# Patient Record
Sex: Male | Born: 1955 | Race: Black or African American | Hispanic: No | Marital: Single | Smoking: Former smoker
Health system: Southern US, Community
[De-identification: ages and names within clinical notes are randomized; demographics above are authoritative.]

## PROBLEM LIST (undated history)

## (undated) DIAGNOSIS — I809 Phlebitis and thrombophlebitis of unspecified site: Secondary | ICD-10-CM

## (undated) DIAGNOSIS — I10 Essential (primary) hypertension: Secondary | ICD-10-CM

## (undated) DIAGNOSIS — M1990 Unspecified osteoarthritis, unspecified site: Secondary | ICD-10-CM

## (undated) HISTORY — PX: OTHER SURGICAL HISTORY: SHX169

---

## 2001-11-15 HISTORY — PX: JOINT REPLACEMENT: SHX530

## 2016-08-16 ENCOUNTER — Encounter: Payer: Self-pay | Admitting: Pediatric Intensive Care

## 2016-09-08 ENCOUNTER — Emergency Department (HOSPITAL_COMMUNITY)
Admission: EM | Admit: 2016-09-08 | Discharge: 2016-09-09 | Disposition: A | Discharge status: Home / Self Care | Payer: Medicaid Other | Attending: Emergency Medicine | Admitting: Emergency Medicine

## 2016-09-08 ENCOUNTER — Emergency Department (HOSPITAL_COMMUNITY): Payer: Medicaid Other

## 2016-09-08 ENCOUNTER — Encounter (HOSPITAL_COMMUNITY): Payer: Self-pay

## 2016-09-08 DIAGNOSIS — M542 Cervicalgia: Secondary | ICD-10-CM

## 2016-09-08 DIAGNOSIS — W050XXA Fall from non-moving wheelchair, initial encounter: Secondary | ICD-10-CM | POA: Diagnosis not present

## 2016-09-08 DIAGNOSIS — M25552 Pain in left hip: Secondary | ICD-10-CM | POA: Insufficient documentation

## 2016-09-08 DIAGNOSIS — S50312A Abrasion of left elbow, initial encounter: Secondary | ICD-10-CM | POA: Insufficient documentation

## 2016-09-08 DIAGNOSIS — I10 Essential (primary) hypertension: Secondary | ICD-10-CM | POA: Diagnosis not present

## 2016-09-08 DIAGNOSIS — W19XXXA Unspecified fall, initial encounter: Secondary | ICD-10-CM

## 2016-09-08 DIAGNOSIS — Y939 Activity, unspecified: Secondary | ICD-10-CM | POA: Diagnosis not present

## 2016-09-08 DIAGNOSIS — R930 Abnormal findings on diagnostic imaging of skull and head, not elsewhere classified: Secondary | ICD-10-CM | POA: Insufficient documentation

## 2016-09-08 DIAGNOSIS — M546 Pain in thoracic spine: Secondary | ICD-10-CM | POA: Diagnosis not present

## 2016-09-08 DIAGNOSIS — Y929 Unspecified place or not applicable: Secondary | ICD-10-CM | POA: Insufficient documentation

## 2016-09-08 DIAGNOSIS — Y999 Unspecified external cause status: Secondary | ICD-10-CM | POA: Diagnosis not present

## 2016-09-08 DIAGNOSIS — M25511 Pain in right shoulder: Secondary | ICD-10-CM | POA: Insufficient documentation

## 2016-09-08 DIAGNOSIS — M25512 Pain in left shoulder: Secondary | ICD-10-CM | POA: Diagnosis not present

## 2016-09-08 DIAGNOSIS — S59902A Unspecified injury of left elbow, initial encounter: Secondary | ICD-10-CM | POA: Diagnosis present

## 2016-09-08 MED ORDER — HYDROCODONE-ACETAMINOPHEN 5-325 MG PO TABS
1.0000 | ORAL_TABLET | Freq: Once | ORAL | Status: AC
  Administered 2016-09-09: 1 via ORAL
  Filled 2016-09-08: qty 1

## 2016-09-08 NOTE — ED Notes (Signed)
Pt in xray- will medicate when returned to room

## 2016-09-08 NOTE — ED Triage Notes (Signed)
Pt reports hip pain that he has had for 16 months. Pt reports the pain has gotten so bad he is now confined to a wheelchair. Pt reports today he fell out of his wheelchair. He reports pain in the shoulders, left hip, and neck.

## 2016-09-08 NOTE — ED Notes (Signed)
Pt placed in c-collar due to cervical tenderness by spine.

## 2016-09-09 ENCOUNTER — Emergency Department (HOSPITAL_COMMUNITY): Payer: Medicaid Other

## 2016-09-09 MED ORDER — HYDROCODONE-ACETAMINOPHEN 5-325 MG PO TABS
1.0000 | ORAL_TABLET | Freq: Once | ORAL | Status: AC
  Administered 2016-09-09: 1 via ORAL
  Filled 2016-09-09: qty 1

## 2016-09-09 MED ORDER — CYCLOBENZAPRINE HCL 10 MG PO TABS
5.0000 mg | ORAL_TABLET | Freq: Once | ORAL | Status: AC
  Administered 2016-09-09: 5 mg via ORAL
  Filled 2016-09-09: qty 1

## 2016-09-09 MED ORDER — CYCLOBENZAPRINE HCL 5 MG PO TABS: 5.0000 mg | ORAL_TABLET | Freq: Three times a day (TID) | ORAL | 0 refills | Status: DC | PRN

## 2016-09-09 MED ORDER — HYDROCODONE-ACETAMINOPHEN 5-325 MG PO TABS: 1.0000 | ORAL_TABLET | ORAL | 0 refills | Status: DC | PRN

## 2016-09-09 MED FILL — HYDROCODON-APAP 5-325: 5-325 | 3 days supply | Qty: 18 | Fill #0

## 2016-09-09 NOTE — ED Provider Notes (Signed)
[facility]-EMERGENCY DEPT Provider Note   CSN: [id number] Arrival date & time: 09/08/16  1626     History   Chief Complaint Chief Complaint  Patient presents with  . Hip Pain  . Fall    HPI Kyle Fleming is a 60 y.o. male.  Kyle Fleming is a 60 y.o. male recently new to area from [place] with h/o HTN presents to ED s/p fall. Patient reports this morning he was going up a wheelchair ramp to enter a bus when his wheelchair did a "wheely," falling backward causing patient to fall out of the wheel chair. He denies any head trauma or LOC. He is on Plavix, no other anti-coagulation therapy. He was evaluated at the scene by EMS and felt okay; however, as the day has progressed he has had increasing myalgias and pain. He presents with pain in b/l shoulders, neck, thoracic back, and left hip. He also endorses a small superficial abrasion to his left elbow. Last tetanus shot within the last year. No treatments tried PTA. Denies fever, numbness, weakness, loss of bowel or bladder function, IVDU, h/o cancer, or chronic corticosteroid use. Patient is wheelchair bound secondary to left chronic hip pain due to arthritis.       History reviewed. No pertinent past medical history.  There are no active problems to display for this patient.   History reviewed. No pertinent surgical history.     Home Medications    Prior to Admission medications   Medication Sig Start Date End Date Taking? Authorizing Provider  clopidogrel (PLAVIX) 75 MG tablet Take 75 mg by mouth daily.   Yes Historical Provider, MD  enalapril (VASOTEC) 10 MG tablet Take 10 mg by mouth daily.   Yes Historical Provider, MD  hydrochlorothiazide (HYDRODIURIL) 25 MG tablet Take 25 mg by mouth daily.   Yes Historical Provider, MD  lovastatin (MEVACOR) 20 MG tablet Take 20 mg by mouth daily at 6 PM.    Yes Historical Provider, MD  cyclobenzaprine (FLEXERIL) 5 MG tablet Take 1 tablet (5 mg total) by mouth 3 (three) times daily as  needed. 09/09/16   Lona Kettle, PA-C  HYDROcodone-acetaminophen (NORCO/VICODIN) 5-325 MG tablet Take 1 tablet by mouth every 4 (four) hours as needed. 09/09/16   Lona Kettle, PA-C    Family History History reviewed. No pertinent family history.  Social History Social History  Substance Use Topics  . Smoking status: Never Smoker  . Smokeless tobacco: Never Used  . Alcohol use Yes     Comment: occ     Allergies   Amitriptyline; Naproxen; and Penicillins   Review of Systems Review of Systems  Constitutional: Negative for fever.  HENT: Negative for trouble swallowing.   Eyes: Negative for visual disturbance.  Respiratory: Negative for shortness of breath.   Cardiovascular: Negative for chest pain.  Gastrointestinal: Negative for abdominal pain, blood in stool, constipation, diarrhea, nausea and vomiting.  Genitourinary: Negative for dysuria and hematuria.  Musculoskeletal: Positive for arthralgias, back pain and neck pain.  Skin: Positive for wound. Negative for rash.  Neurological: Negative for syncope, weakness, numbness and headaches.     Physical Exam Updated Vital Signs BP 140/80 (BP Location: Right Arm)   Pulse 83   Temp 98.6 F (37 C)   Resp 16   SpO2 100%   Physical Exam  Constitutional: He appears well-developed and well-nourished. No distress.  HENT:  Head: Normocephalic and atraumatic. Head is without raccoon's eyes and without Battle's sign.  Mouth/Throat: Oropharynx is clear  and moist. No oropharyngeal exudate.  Eyes: Conjunctivae and EOM are normal. Pupils are equal, round, and reactive to light. Right eye exhibits no discharge. Left eye exhibits no discharge. No scleral icterus.  Neck: Normal range of motion and phonation normal. Neck supple. Spinous process tenderness and muscular tenderness present. No neck rigidity. Normal range of motion present.  Mild TTP of cervical spine. TTP of b/l trapezius muscles. Neck ROM intact.     Cardiovascular: Normal rate, regular rhythm, normal heart sounds and intact distal pulses.   No murmur heard. Pulmonary/Chest: Effort normal and breath sounds normal. No stridor. No respiratory distress. He has no wheezes. He has no rales.  Abdominal: Soft. Bowel sounds are normal. He exhibits no distension. There is no tenderness. There is no rigidity, no rebound, no guarding and no CVA tenderness.  Musculoskeletal: Normal range of motion.       Left hip: He exhibits tenderness. He exhibits no swelling and no deformity.       Cervical back: He exhibits tenderness. He exhibits no deformity.       Thoracic back: He exhibits tenderness.  Mild TTP of thoracic spine and left paravertebral muscles; no obvious deformity; no step off. No obvious deformity, step off, or TTP of midline lumbar spine. No TTP of shoulder girdle b/l; ROM, strength, sensation, and distal pulses intact. TTP of left hip; no obvious deformity, swelling, discoloration; able to lift leg minimally; sensation intact; dorsiflexion/plantar flexion equal b/l; distal pulses intact.   Lymphadenopathy:    He has no cervical adenopathy.  Neurological: He is alert. He has normal strength. He is not disoriented. No sensory deficit. Coordination normal. GCS eye subscore is 4. GCS verbal subscore is 5. GCS motor subscore is 6.  Mental Status:  Alert, thought content appropriate, able to give a coherent history. Speech fluent without evidence of aphasia. Able to follow 2 step commands without difficulty.  Cranial Nerves:  II:  Peripheral visual fields grossly normal, pupils equal, round, reactive to light III,IV, VI: ptosis not present, extra-ocular motions intact bilaterally  V,VII: smile symmetric, facial light touch sensation equal VIII: hearing grossly normal to voice  X: uvula elevates symmetrically  XI: bilateral shoulder shrug symmetric and strong XII: midline tongue extension without fassiculations Motor:  Normal tone. Strong and  equal grip strength and dorsiflexion/plantar flexion Sensory: light touch normal in all extremities. Cerebellar: normal finger-to-nose with bilateral upper extremities Gait:deferred, pt wheel chair bound secondary to chronic left hip pain CV: distal pulses palpable throughout   Skin: Skin is warm and dry. He is not diaphoretic.     Psychiatric: He has a normal mood and affect. His behavior is normal.     ED Treatments / Results  Labs (all labs ordered are listed, but only abnormal results are displayed) Labs Reviewed - No data to display  EKG  EKG Interpretation None       Radiology Dg Cervical Spine Complete  Result Date: 09/09/2016 CLINICAL DATA:  60 year old male with fall and neck pain. EXAM: CERVICAL SPINE - COMPLETE 4+ VIEW COMPARISON:  None. FINDINGS: A bone fragment to the left of the C3 vertebra may represent soft tissue or cartilage calcification versus fracture of the left C3 transverse process. Correlation with point tenderness recommended. CT is recommended for further evaluation. No other acute fracture identified. No acute subluxation. There is degenerative changes most prominent at C5-C6 and C6-C7 with disc space narrowing and bone spurring and osteophyte formation. There is multilevel facet hypertrophy and multilevel bilateral neural  foramina narrowing. The visualized spinous processes and the odontoid appear intact. There is anatomic alignment of the lateral masses of the C1 and C2. The soft tissues appear unremarkable. IMPRESSION: Soft tissue calcification versus fracture of the left C3 transverse process. Clinical correlation is recommended. CT is recommended for further evaluation. No definite vertebral body fracture. No acute subluxation. Electronically Signed   By: Elgie Collard M.D.   On: 09/09/2016 00:29   Dg Thoracic Spine 2 View  Result Date: 09/09/2016 CLINICAL DATA:  Larey Seat today, neck and back pain. EXAM: THORACIC SPINE 2 VIEWS COMPARISON:  None.  FINDINGS: There is no evidence of thoracic spine fracture. Alignment is normal. No other significant bone abnormalities are identified. IMPRESSION: Negative. Electronically Signed   By: Awilda Metro M.D.   On: 09/09/2016 00:33   Dg Shoulder Right  Result Date: 09/08/2016 CLINICAL DATA:  Right shoulder pain.  Status post fall. EXAM: RIGHT SHOULDER - 2+ VIEW COMPARISON:  None. FINDINGS: There is no fracture or dislocation. There are mild degenerative changes of the acromioclavicular joint. IMPRESSION: No acute osseous injury of the right shoulder. Electronically Signed   By: Elige Ko   On: 09/08/2016 18:04   Dg Elbow Complete Left  Result Date: 09/08/2016 CLINICAL DATA:  Fall from wheelchair with elbow pain, initial encounter EXAM: LEFT ELBOW - COMPLETE 3+ VIEW COMPARISON:  None. FINDINGS: No acute fracture or dislocation is noted. No joint effusion is seen. A needle fragment is noted within the antecubital fossa. Correlate with clinical history. IMPRESSION: No acute bony abnormality noted. Needle fragment noted within the antecubital fossa. Correlate with clinical history. Electronically Signed   By: Alcide Clever M.D.   On: 09/08/2016 18:05   Ct Head Wo Contrast  Result Date: 09/09/2016 CLINICAL DATA:  History of fall from wheelchair. Possible fracture on radiograph EXAM: CT HEAD WITHOUT CONTRAST CT CERVICAL SPINE WITHOUT CONTRAST TECHNIQUE: Multidetector CT imaging of the head and cervical spine was performed following the standard protocol without intravenous contrast. Multiplanar CT image reconstructions of the cervical spine were also generated. COMPARISON:  Radiograph 09/08/2016, CT head 03/02/2003 FINDINGS: CT HEAD FINDINGS Brain: No evidence of acute infarction, hemorrhage, hydrocephalus, extra-axial collection or mass lesion/mass effect. Mild atrophy. Vascular: No hyperdense vessels. Calcifications within the carotid arteries at the skullbase. Skull: Mastoid air cells are clear.  No  depressed skull fractures. Sinuses/Orbits: Mild mucosal thickening in the ethmoid sinus. Old right nasal bone deformity. Old left medial wall orbital fracture with mild fat herniation toward the adjacent ethmoid sinus. Other: None CT CERVICAL SPINE FINDINGS Alignment: Straightening of the cervical spine. No subluxation. Normal facet alignment. Skull base and vertebrae: Craniovertebral junction is intact. The vertebral body heights are maintained. There is no fracture. Soft tissues and spinal canal: Prevertebral soft tissue thickness appears normal. No paravertebral or paraspinal soft tissue abnormality. Disc levels: Marked disc space narrowing at C5-C6 and C6-C7 with anterior osteophytes from C3 through T1. Posterior disc osteophyte complex at C5-C6 and C6-C7 results in mild to moderate canal stenosis. Bilateral hypertrophic facet disease results in multilevel foraminal stenosis. Upper chest: Lung apices clear. Other: There is a coarse left carotid artery calcification felt to correspond to the radiographic abnormality. IMPRESSION: 1. No CT evidence for acute intracranial abnormality. Old appearing right nasal bone and left medial orbital wall fractures. 2. Straightening of the cervical spine with multilevel degenerative disc disease. No fracture or malalignment. Carotid artery calcification on the left appears to correspond to the radiographic abnormality. Electronically Signed   By:  Jasmine PangKim  Fujinaga M.D.   On: 09/09/2016 02:22   Ct Cervical Spine Wo Contrast  Result Date: 09/09/2016 CLINICAL DATA:  History of fall from wheelchair. Possible fracture on radiograph EXAM: CT HEAD WITHOUT CONTRAST CT CERVICAL SPINE WITHOUT CONTRAST TECHNIQUE: Multidetector CT imaging of the head and cervical spine was performed following the standard protocol without intravenous contrast. Multiplanar CT image reconstructions of the cervical spine were also generated. COMPARISON:  Radiograph 09/08/2016, CT head 03/02/2003 FINDINGS: CT  HEAD FINDINGS Brain: No evidence of acute infarction, hemorrhage, hydrocephalus, extra-axial collection or mass lesion/mass effect. Mild atrophy. Vascular: No hyperdense vessels. Calcifications within the carotid arteries at the skullbase. Skull: Mastoid air cells are clear.  No depressed skull fractures. Sinuses/Orbits: Mild mucosal thickening in the ethmoid sinus. Old right nasal bone deformity. Old left medial wall orbital fracture with mild fat herniation toward the adjacent ethmoid sinus. Other: None CT CERVICAL SPINE FINDINGS Alignment: Straightening of the cervical spine. No subluxation. Normal facet alignment. Skull base and vertebrae: Craniovertebral junction is intact. The vertebral body heights are maintained. There is no fracture. Soft tissues and spinal canal: Prevertebral soft tissue thickness appears normal. No paravertebral or paraspinal soft tissue abnormality. Disc levels: Marked disc space narrowing at C5-C6 and C6-C7 with anterior osteophytes from C3 through T1. Posterior disc osteophyte complex at C5-C6 and C6-C7 results in mild to moderate canal stenosis. Bilateral hypertrophic facet disease results in multilevel foraminal stenosis. Upper chest: Lung apices clear. Other: There is a coarse left carotid artery calcification felt to correspond to the radiographic abnormality. IMPRESSION: 1. No CT evidence for acute intracranial abnormality. Old appearing right nasal bone and left medial orbital wall fractures. 2. Straightening of the cervical spine with multilevel degenerative disc disease. No fracture or malalignment. Carotid artery calcification on the left appears to correspond to the radiographic abnormality. Electronically Signed   By: Jasmine PangKim  Fujinaga M.D.   On: 09/09/2016 02:22   Ct Hip Left Wo Contrast  Result Date: 09/09/2016 CLINICAL DATA:  60 year old male with fall and left hip pain. EXAM: CT OF THE LEFT HIP WITHOUT CONTRAST TECHNIQUE: Multidetector CT imaging of the left hip was  performed according to the standard protocol. Multiplanar CT image reconstructions were also generated. COMPARISON:  Radiograph dated 09/08/2016 FINDINGS: Bones/Joint/Cartilage There is severe osteoarthritic changes of the left hip. There is severe cortical sclerosis and degeneration as well as subcortical cysts with overall remodeling of the acetabulum and the femoral head. There is proximal migration of the femoral head within the acetabulum with complete loss of joint space. There is bone spurring and osteophyte formation surrounding the head of the femur and acetabular rim. No definite acute fracture identified, however evaluation for fracture is very limited due to osteopenia and severe degenerative changes as well as artifact caused by patient's body habitus. Ill-defined linear lucencies in the region of the femoral head and neck appear peripheral and likely related to degenerative changes and osteophyte formation. Clinical correlation is recommended. If there is high clinical concern for an acute fracture, MRI is recommended for further assessment. There is no dislocation. Ligaments Suboptimally assessed by CT. Muscles and Tendons Grossly unremarkable. No inflammatory changes or fluid collection/hematoma. Soft tissues Unremarkable as visualized. No fluid collection or hematoma. Colonic diverticula. IMPRESSION: No definite acute fracture or dislocation. If there is high clinical concern for fracture MRI is recommended for further evaluation. Severe osteoarthritic changes of the left hip with remodeling of the joint and near complete loss of joint space. Electronically Signed   By:  Elgie Collard M.D.   On: 09/09/2016 00:23   Dg Shoulder Left  Result Date: 09/08/2016 CLINICAL DATA:  Left shoulder pain status post fall EXAM: LEFT SHOULDER - 2+ VIEW COMPARISON:  None. FINDINGS: There is no fracture or dislocation. There are mild degenerative changes of the acromioclavicular joint. IMPRESSION: No acute  osseous injury of the left shoulder. Electronically Signed   By: Elige Ko   On: 09/08/2016 18:05   Dg Knee Complete 4 Views Right  Result Date: 09/08/2016 CLINICAL DATA:  Fall from wheelchair with right knee pain, initial encounter EXAM: RIGHT KNEE - COMPLETE 4+ VIEW COMPARISON:  None. FINDINGS: Degenerative changes are noted in all 3 joint compartments. No acute fracture or dislocation is seen. A small joint effusion is noted. No gross soft tissue abnormality is noted. IMPRESSION: Degenerative changes with small joint effusion. No acute fracture seen. Electronically Signed   By: Alcide Clever M.D.   On: 09/08/2016 18:06   Dg Hip Unilat W Or Wo Pelvis 2-3 Views Left  Result Date: 09/08/2016 CLINICAL DATA:  Fall from wheelchair with left hip pain, initial encounter EXAM: DG HIP (WITH OR WITHOUT PELVIS) 2-3V LEFT COMPARISON:  None. FINDINGS: Right hip prosthesis is noted in satisfactory position. Severe degenerative changes of the left hip joint are noted. Some remodeling of the superior aspect of the acetabulum is seen. No acute fracture or dislocation is noted. Vascular stenting is noted on the right. IMPRESSION: Severe degenerative change of the left hip joint as described. No acute abnormality noted. Electronically Signed   By: Alcide Clever M.D.   On: 09/08/2016 18:07    Procedures Procedures (including critical care time)  Medications Ordered in ED Medications  HYDROcodone-acetaminophen (NORCO/VICODIN) 5-325 MG per tablet 1 tablet (1 tablet Oral Given 09/09/16 0021)  cyclobenzaprine (FLEXERIL) tablet 5 mg (5 mg Oral Given 09/09/16 0446)  HYDROcodone-acetaminophen (NORCO/VICODIN) 5-325 MG per tablet 1 tablet (1 tablet Oral Given 09/09/16 0446)     Initial Impression / Assessment and Plan / ED Course  I have reviewed the triage vital signs and the nursing notes.  Pertinent labs & imaging results that were available during my care of the patient were reviewed by me and considered in my  medical decision making (see chart for details).  Clinical Course  Value Comment By Time  DG Elbow Complete Left Needle noted in left decubital fossa.  Lona Kettle, [place] 10/25 2300  DG Hip Unilat W or Wo Pelvis 2-3 Views Left Significant degenerative changes noted to left hip.  Lona Kettle, [place] 10/25 2300  CT Hip Left Wo Contrast Reviewed Lona Kettle, PA-C 10/26 0040   CT head and neck reviewed.  Lona Kettle, [place] 10/26 [phone]   Patient endorses improvement in pain.  Lona Kettle, [place] 10/26 [phone]    Patient presents to ED s/p fall with complaint of left hip pain, b/l shoulder pain, neck and thoracic back pain. Patient is afebrile and non-toxic appearing in NAD. VSS. Initial x-rays while in triage included left elbow, b/l shoulders, left hip w/pelvis, and right knee. X-ray of shoulders b/l negative for acute fx or dislocation with mild arthritic changes noted; suspect MSK soreness as trapezius muscles are TTP b/l. Left hip x-ray shows significant arthritic changes without evidence of obvious fracture or dislocation. X-ray left elbow shows no fracture or dislocation; however, needle fragment noted in left antecubital fossa. Patient unsure of how fragment got there - denies IVDU or diabetes requiring insulin. Denies any  pain at the site. No TTP or obvious deformity; ROM intact; provided information for establishing a PCP to discuss possible OP removal. X-ray right knee negative for obvious fracture or dislocation, small joint effusion may be indicative of ligamentous sprain; patient is bending knee through ROM without issue. Following my assessment will image cervical and thoracic spine and CT hip for further evaluation. Pain medication given in ED.   On re-evaluation patient endorses improvement in pain he is moving left leg more freely. CT hip shows again significant degenerative changes, no obvious fracture or dislocation. Thoracic spine nml. On cervical spine x-ray  concern for possible C3 left transverse process fracture, recommend follow up CT for further evaluation. Will CT neck; given possible cervical fracture will also CT head to r/o intracranial bleed considering patient is on plavix.   CT head negative for acute intracranial abnormality. CT neck negative for fracture or subluxation; DDD noted as well as carotid artery calcification which may represent the initial radiologic finding.   Discussed results and plan with patient. Patient's pain improved with tx. Discussed symptomatic management to include RICE and motrin for mild to moderate pain. Rx vicodin for severe pain and flexeril for muscle pain. Review of [place] controlled substance database shows no recent narcotic rx. Resources for establishing a PCP provided, encouraged follow up. Orthopedic referral provided given significant arthritic changes of left hip. Return precautions given. Patient voiced understanding and is agreeable.    Final Clinical Impressions(s) / ED Diagnoses   Final diagnoses:  Fall, initial encounter  Pain of left hip joint  Acute pain of both shoulders  Neck pain  Acute left-sided thoracic back pain    New Prescriptions Discharge Medication List as of 09/09/2016  2:50 AM    START taking these medications   Details  cyclobenzaprine (FLEXERIL) 5 MG tablet Take 1 tablet (5 mg total) by mouth 3 (three) times daily as needed., Starting Thu 09/09/2016, Print    HYDROcodone-acetaminophen (NORCO/VICODIN) 5-325 MG tablet Take 1 tablet by mouth every 4 (four) hours as needed., Starting Thu 09/09/2016, Print         [place], PA-C 09/09/16 1100    Glynn Octave, MD 09/09/16 2329

## 2016-09-09 NOTE — Discharge Instructions (Signed)
Read the information below.  Your imaging is remarkable for significant arthritis in your left hip. No obvious fracture or dislocation is present. You also have a residual needle in your left at the level of your elbow. You can follow up outpatient with a primary provider regarding removal. The rest of your imaging was re-assuring.  You can take motrin 400mg  every 6hrs for mild to moderate pain. I have prescribed vicodin for severe pain. I have also prescribed flexeril for muscle pain. Vicodin and flexeril can make you drowsy, do not drive after taking and try to space out dosing.  You can apply ice for 20 minute increments to affected areas for the next 48 hours.  Use the prescribed medication as directed.  Please discuss all new medications with your pharmacist.  I have provided resources and the [facility] to help establish a primary provider, please call.  I have also provided the contact information for orthopedics to follow up regarding your left hip pain, please call to schedule an appointment.   You may return to the Emergency Department at any time for worsening condition or any new symptoms that concern you.

## 2016-09-17 ENCOUNTER — Emergency Department (HOSPITAL_COMMUNITY)
Admission: EM | Admit: 2016-09-17 | Discharge: 2016-09-17 | Disposition: A | Discharge status: Home / Self Care | Payer: Medicaid Other | Attending: Emergency Medicine | Admitting: Emergency Medicine

## 2016-09-17 ENCOUNTER — Emergency Department (HOSPITAL_COMMUNITY): Payer: Medicaid Other

## 2016-09-17 ENCOUNTER — Encounter (HOSPITAL_COMMUNITY): Payer: Self-pay | Admitting: Emergency Medicine

## 2016-09-17 DIAGNOSIS — M1990 Unspecified osteoarthritis, unspecified site: Secondary | ICD-10-CM

## 2016-09-17 DIAGNOSIS — G8929 Other chronic pain: Secondary | ICD-10-CM | POA: Diagnosis not present

## 2016-09-17 DIAGNOSIS — W050XXA Fall from non-moving wheelchair, initial encounter: Secondary | ICD-10-CM | POA: Diagnosis not present

## 2016-09-17 DIAGNOSIS — Y929 Unspecified place or not applicable: Secondary | ICD-10-CM | POA: Insufficient documentation

## 2016-09-17 DIAGNOSIS — M25552 Pain in left hip: Secondary | ICD-10-CM | POA: Insufficient documentation

## 2016-09-17 DIAGNOSIS — M25561 Pain in right knee: Secondary | ICD-10-CM | POA: Diagnosis not present

## 2016-09-17 DIAGNOSIS — Y939 Activity, unspecified: Secondary | ICD-10-CM | POA: Insufficient documentation

## 2016-09-17 DIAGNOSIS — W19XXXA Unspecified fall, initial encounter: Secondary | ICD-10-CM

## 2016-09-17 DIAGNOSIS — Y999 Unspecified external cause status: Secondary | ICD-10-CM | POA: Insufficient documentation

## 2016-09-17 MED ORDER — HYDROCODONE-ACETAMINOPHEN 5-325 MG PO TABS: 1.0000 | ORAL_TABLET | Freq: Four times a day (QID) | ORAL | 0 refills | Status: DC | PRN

## 2016-09-17 MED ORDER — OXYCODONE HCL 5 MG PO TABS
10.0000 mg | ORAL_TABLET | Freq: Once | ORAL | Status: AC
  Administered 2016-09-17: 10 mg via ORAL
  Filled 2016-09-17: qty 2

## 2016-09-17 MED FILL — HYDROCODON-APAP 5-325: 5-325 | 5 days supply | Qty: 18 | Fill #0

## 2016-09-17 NOTE — ED Triage Notes (Signed)
Pt sts left hip pain since falling from wheel chair several days ago; pt sts worsening pain and ran out of pain meds has ortho appointment on Monday

## 2016-09-17 NOTE — ED Provider Notes (Signed)
[facility]-EMER[facility]ENCY DEPT Provider Note   CSN: [id number] Arrival date & time: 09/17/16  8295     History   Chief Complaint Chief Complaint  Patient presents with  . Hip Pain    HPI Kyle Fleming is a 60 y.o. male. With a hx of severe arthritis, wheelchair bound at baseline because of this with recent fall on 10/25 wherein he fell on his left hip and precipitated acute on chronic left hip pain. He had a CT scan at that time which showed no acute fracture but noted severe arthritic changes.  3 days ago he had another mechanical fall from his wheelchair causing him to land on his right knee. He notes similar chronic knee pain due to OA but states that it is worse than it normally is now after his second fall. He has still been able to bear some weight on it. The patient states that he was compliant with his pain medication prescription however he has run out of medications and states that his pain is too severe to be controlled by OTC NSAIDs. He has an appointment with ortho for further evaluation of this on Monday. His primary reason for presentation to the ED today is for refill of his Norco prescription to get him through until his appointment on Monday.   HPI  History reviewed. No pertinent past medical history.  There are no active problems to display for this patient.   History reviewed. No pertinent surgical history.     Home Medications    Prior to Admission medications   Medication Sig Start Date End Date Taking? Authorizing Provider  clopidogrel (PLAVIX) 75 MG tablet Take 75 mg by mouth daily.   Yes Historical Provider, MD  enalapril (VASOTEC) 10 MG tablet Take 10 mg by mouth daily.   Yes Historical Provider, MD  hydrochlorothiazide (HYDRODIURIL) 25 MG tablet Take 25 mg by mouth daily.   Yes Historical Provider, MD  lovastatin (MEVACOR) 20 MG tablet Take 20 mg by mouth daily at 6 PM.    Yes Historical Provider, MD  cyclobenzaprine (FLEXERIL) 5 MG tablet Take 1 tablet (5 mg  total) by mouth 3 (three) times daily as needed. 09/09/16   Lona Kettle, PA-C  HYDROcodone-acetaminophen (NORCO/VICODIN) 5-325 MG tablet Take 1 tablet by mouth every 6 (six) hours as needed for severe pain. 09/17/16   Francoise Ceo, DO    Family History History reviewed. No pertinent family history.  Social History Social History  Substance Use Topics  . Smoking status: Never Smoker  . Smokeless tobacco: Never Used  . Alcohol use Yes     Comment: occ     Allergies   Amitriptyline; Naproxen; and Penicillins   Review of Systems Review of Systems  Constitutional: Positive for activity change. Negative for chills and fever.  Respiratory: Negative for cough, chest tightness and shortness of breath.   Cardiovascular: Negative for chest pain.  Gastrointestinal: Negative for abdominal pain, nausea and vomiting.  Genitourinary: Negative for difficulty urinating, discharge, dysuria, penile pain and penile swelling.  Musculoskeletal: Positive for arthralgias (left hip and right knee) and gait problem. Negative for back pain, joint swelling and neck pain.  Skin: Negative for rash and wound.  Neurological: Negative for syncope, weakness and numbness.  All other systems reviewed and are negative.    Physical Exam Updated Vital Signs BP 127/91   Pulse 90   Temp 98.5 F (36.9 C) (Oral)   Resp 16   SpO2 96%   Physical Exam  Constitutional: He is oriented to person, place, and time. He appears well-developed and well-nourished. No distress.  HENT:  Head: Normocephalic and atraumatic.  Nose: Nose normal.  Mouth/Throat: Oropharynx is clear and moist.  Eyes: Conjunctivae and EOM are normal. Pupils are equal, round, and reactive to light.  Neck: Neck supple.  Cardiovascular: Normal rate, regular rhythm, normal heart sounds and intact distal pulses.   Pulmonary/Chest: Effort normal and breath sounds normal.  Abdominal: Soft. He exhibits no distension. There is no tenderness.  Hernia confirmed negative in the right inguinal area and confirmed negative in the left inguinal area.  Genitourinary: Testes normal and penis normal.  Musculoskeletal: He exhibits tenderness. He exhibits no edema.       Left hip: He exhibits decreased range of motion, bony tenderness and crepitus. He exhibits no tenderness, no deformity and no laceration.       Right knee: He exhibits decreased range of motion (slightly), swelling (mild) and bony tenderness (mild). He exhibits no ecchymosis, no deformity, no laceration and no erythema.  Neurological: He is alert and oriented to person, place, and time. He has normal strength. No cranial nerve deficit or sensory deficit. Coordination normal. GCS eye subscore is 4. GCS verbal subscore is 5. GCS motor subscore is 6.  Skin: Skin is warm and dry. No rash noted. He is not diaphoretic.  Nursing note and vitals reviewed.    ED Treatments / Results  Labs (all labs ordered are listed, but only abnormal results are displayed) Labs Reviewed - No data to display  EKG  EKG Interpretation None       Radiology Dg Knee 2 Views Right  Result Date: 09/17/2016 CLINICAL DATA:  To recent falls.  Swelling and pain. EXAM: RIGHT KNEE - 1-2 VIEW COMPARISON:  September 08, 2016 FINDINGS: Severe degenerative changes in the lateral and patellofemoral compartments with a small joint effusion. No acute fractures are seen. IMPRESSION: Degenerative changes with small joint effusion. Electronically Signed   By: Gerome Samavid  Williams III M.D   On: 09/17/2016 12:05    Procedures Procedures (including critical care time)  Medications Ordered in ED Medications  oxyCODONE (Oxy IR/ROXICODONE) immediate release tablet 10 mg (10 mg Oral Given 09/17/16 1129)     Initial Impression / Assessment and Plan / ED Course  I have reviewed the triage vital signs and the nursing notes.  Pertinent labs & imaging results that were available during my care of the patient were reviewed by  me and considered in my medical decision making (see chart for details).  Clinical Course   60 year old male presents with history of severe osteoarthritis and recent fall on 10/25 with negative CT scan of left hip, presents after another fall from his wheelchair onto his right knee, and requesting refill of his pain medication.   X-ray was done of his right knee and showed significant degenerative changes, but no fractures. Exam as above with no other noted injuries. The patient's close follow-up on Monday with radiologic evidence of severe osteoarthritis, feel comfortable prescribing him a few more pain medications to get him through the weekend until he can see his orthopedist on Monday. This plan was discussed with the patient at the bedside and he stated both understanding and agreement.   Final Clinical Impressions(s) / ED Diagnoses   Final diagnoses:  Left hip pain  Fall, initial encounter  Arthritis  Chronic pain of right knee    New Prescriptions Discharge Medication List as of 09/17/2016 12:21 PM  Francoise CeoWarren S Katianne Barre, DO 09/18/16 [id number]    Charlynne Panderavid Hsienta Yao, MD 09/20/16 [phone]

## 2016-09-17 NOTE — ED Notes (Addendum)
Pt wheeled to waiting room in personal wheelchair. NAD. A/o x4. Verbalizes understanding of discharge instructions.

## 2016-09-17 NOTE — ED Notes (Signed)
Patient transporting to xray. 

## 2016-09-17 NOTE — ED Notes (Signed)
Wheeled pt back to room. Pt placed in gown and on monitor. 

## 2016-09-20 ENCOUNTER — Ambulatory Visit (INDEPENDENT_AMBULATORY_CARE_PROVIDER_SITE_OTHER): Payer: Medicaid Other | Admitting: Orthopaedic Surgery

## 2016-09-20 DIAGNOSIS — M1711 Unilateral primary osteoarthritis, right knee: Secondary | ICD-10-CM

## 2016-09-20 DIAGNOSIS — M25552 Pain in left hip: Secondary | ICD-10-CM | POA: Diagnosis not present

## 2016-09-20 DIAGNOSIS — G8929 Other chronic pain: Secondary | ICD-10-CM | POA: Diagnosis not present

## 2016-09-20 DIAGNOSIS — M25561 Pain in right knee: Secondary | ICD-10-CM

## 2016-09-20 DIAGNOSIS — M1612 Unilateral primary osteoarthritis, left hip: Secondary | ICD-10-CM | POA: Diagnosis not present

## 2016-09-20 MED ORDER — METHYLPREDNISOLONE ACETATE 40 MG/ML IJ SUSP
40.0000 mg | INTRAMUSCULAR | Status: AC | PRN
  Administered 2016-09-20: 40 mg via INTRA_ARTICULAR

## 2016-09-20 MED ORDER — LIDOCAINE HCL 1 % IJ SOLN
3.0000 mL | INTRAMUSCULAR | Status: AC | PRN
  Administered 2016-09-20: 3 mL
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
## 2016-09-21 ENCOUNTER — Other Ambulatory Visit (INDEPENDENT_AMBULATORY_CARE_PROVIDER_SITE_OTHER): Payer: Self-pay | Admitting: Physician Assistant

## 2016-09-23 NOTE — Congregational Nurse Program (Signed)
Congregational Nurse Program Note  Date of Encounter: 08/16/2016  Past Medical History: No past medical history on file.  Encounter Details:  New client- has history of multiple orthopedic issues including hip replacement. Client seeking orthopedics referral. [facility]. Floow up with CN as needed for BP checks.

## 2016-10-04 ENCOUNTER — Encounter (HOSPITAL_COMMUNITY): Payer: Self-pay

## 2016-10-04 ENCOUNTER — Encounter (HOSPITAL_COMMUNITY)
Admission: RE | Admit: 2016-10-04 | Discharge: 2016-10-04 | Disposition: A | Discharge status: Home / Self Care | Payer: Medicaid Other | Source: Ambulatory Visit | Attending: Orthopaedic Surgery | Admitting: Orthopaedic Surgery

## 2016-10-04 DIAGNOSIS — Z87891 Personal history of nicotine dependence: Secondary | ICD-10-CM | POA: Diagnosis not present

## 2016-10-04 DIAGNOSIS — Z01810 Encounter for preprocedural cardiovascular examination: Secondary | ICD-10-CM | POA: Diagnosis not present

## 2016-10-04 DIAGNOSIS — Z79899 Other long term (current) drug therapy: Secondary | ICD-10-CM | POA: Diagnosis not present

## 2016-10-04 DIAGNOSIS — I517 Cardiomegaly: Secondary | ICD-10-CM | POA: Diagnosis not present

## 2016-10-04 DIAGNOSIS — Z01818 Encounter for other preprocedural examination: Secondary | ICD-10-CM | POA: Insufficient documentation

## 2016-10-04 DIAGNOSIS — M1612 Unilateral primary osteoarthritis, left hip: Secondary | ICD-10-CM | POA: Insufficient documentation

## 2016-10-04 DIAGNOSIS — Z7902 Long term (current) use of antithrombotics/antiplatelets: Secondary | ICD-10-CM | POA: Diagnosis not present

## 2016-10-04 DIAGNOSIS — I10 Essential (primary) hypertension: Secondary | ICD-10-CM | POA: Diagnosis not present

## 2016-10-04 HISTORY — DX: Phlebitis and thrombophlebitis of unspecified site: I80.9

## 2016-10-04 HISTORY — DX: Essential (primary) hypertension: I10

## 2016-10-04 HISTORY — DX: Unspecified osteoarthritis, unspecified site: M19.90

## 2016-10-04 LAB — CBC
HEMATOCRIT: 42.6 % (ref 39.0–52.0)
Hemoglobin: 14.3 g/dL (ref 13.0–17.0)
MCH: 31.2 pg (ref 26.0–34.0)
MCHC: 33.6 g/dL (ref 30.0–36.0)
MCV: 93 fL (ref 78.0–100.0)
Platelets: 209 10*3/uL (ref 150–400)
RBC: 4.58 MIL/uL (ref 4.22–5.81)
RDW: 13 % (ref 11.5–15.5)
WBC: 7.1 10*3/uL (ref 4.0–10.5)

## 2016-10-04 LAB — SURGICAL PCR SCREEN
MRSA, PCR: NEGATIVE
Staphylococcus aureus: NEGATIVE

## 2016-10-04 LAB — BASIC METABOLIC PANEL
ANION GAP: 10 (ref 5–15)
BUN: 12 mg/dL (ref 6–20)
CALCIUM: 9.8 mg/dL (ref 8.9–10.3)
CO2: 25 mmol/L (ref 22–32)
Chloride: 102 mmol/L (ref 101–111)
Creatinine, Ser: 0.87 mg/dL (ref 0.61–1.24)
GFR calc Af Amer: >60 mL/min (ref >60)
GFR calc non Af Amer: >60 mL/min (ref >60)
GLUCOSE: 90 mg/dL (ref 65–99)
Potassium: 3.9 mmol/L (ref 3.5–5.1)
Sodium: 137 mmol/L (ref 135–145)

## 2016-10-04 LAB — ABO/RH: ABO/RH(D): B POS

## 2016-10-04 NOTE — Patient Instructions (Signed)
Delorise ShinerBroaderick D Hopping  10/04/2016   Your procedure is scheduled on:   [facility]  [facility] to 3rd floor to  Short Stay Center at AM.  Call this number if you have problems the morning of surgery [phone]   Remember: ONLY 1 PERSON MAY GO WITH YOU TO SHORT STAY TO GET  READY MORNING OF YOUR SURGERY.  Do not eat food or drink liquids :After Midnight.     Take these medicines the morning of surgery with A SIP OF WATER:  DO NOT TAKE ANY DIABETIC MEDICATIONS DAY OF YOUR SURGERY                               You may not have any metal on your body including hair pins and              piercings  Do not wear jewelry, make-up, lotions, powders or perfumes, deodorant             Do not wear nail polish.  Do not shave  48 hours prior to surgery.              Men may shave face and neck.   Do not bring valuables to the hospital. [facility] IS NOT             RESPONSIBLE   FOR VALUABLES.  Contacts, dentures or bridgework may not be worn into surgery.  Leave suitcase in the car. After surgery it may be brought to your room.     Patients discharged the day of surgery will not be allowed to drive home.  Name and phone number of your driver:  Special Instructions: N/A              Please read over the following fact sheets you were given: _____________________________________________________________________             [facility] - Preparing for Surgery Before surgery, you can play an important role.  Because skin is not sterile, your skin needs to be as free of germs as possible.  You can reduce the number of germs on your skin by washing with CHG (chlorahexidine gluconate) soap before surgery.  CHG is an antiseptic cleaner which kills germs and bonds with the skin to continue killing germs even after washing. Please DO NOT use if you have an allergy to CHG or antibacterial soaps.  If your skin becomes reddened/irritated stop using  the CHG and inform your nurse when you arrive at Short Stay. Do not shave (including legs and underarms) for at least 48 hours prior to the first CHG shower.  You may shave your face/neck. Please follow these instructions carefully:  1.  Shower with CHG Soap the night before surgery and the  morning of Surgery.  2.  If you choose to wash your hair, wash your hair first as usual with your  normal  shampoo.  3.  After you shampoo, rinse your hair and body thoroughly to remove the  shampoo.                           4.  Use CHG as you would any other liquid soap.  You can apply chg directly  to the skin and wash  Gently with a scrungie or clean washcloth.  5.  Apply the CHG Soap to your body ONLY FROM THE NECK DOWN.   Do not use on face/ open                           Wound or open sores. Avoid contact with eyes, ears mouth and genitals (private parts).                       Wash face,  Genitals (private parts) with your normal soap.             6.  Wash thoroughly, paying special attention to the area where your surgery  will be performed.  7.  Thoroughly rinse your body with warm water from the neck down.  8.  DO NOT shower/wash with your normal soap after using and rinsing off  the CHG Soap.                9.  Pat yourself dry with a clean towel.            10.  Wear clean pajamas.            11.  Place clean sheets on your bed the night of your first shower and do not  sleep with pets. Day of Surgery : Do not apply any lotions/deodorants the morning of surgery.  Please wear clean clothes to the hospital/surgery center.  FAILURE TO FOLLOW THESE INSTRUCTIONS MAY RESULT IN THE CANCELLATION OF YOUR SURGERY PATIENT SIGNATURE_________________________________  NURSE SIGNATURE__________________________________  ________________________________________________________________________   [name]  An incentive spirometer is a tool that can help keep your lungs  clear and active. This tool measures how well you are filling your lungs with each breath. Taking long deep breaths may help reverse or decrease the chance of developing breathing (pulmonary) problems (especially infection) following:  A long period of time when you are unable to move or be active. BEFORE THE PROCEDURE   If the spirometer includes an indicator to show your best effort, your nurse or respiratory therapist will set it to a desired goal.  If possible, sit up straight or lean slightly forward. Try not to slouch.  Hold the incentive spirometer in an upright position. INSTRUCTIONS FOR USE  1. Sit on the edge of your bed if possible, or sit up as far as you can in bed or on a chair. 2. Hold the incentive spirometer in an upright position. 3. Breathe out normally. 4. Place the mouthpiece in your mouth and seal your lips tightly around it. 5. Breathe in slowly and as deeply as possible, raising the piston or the ball toward the top of the column. 6. Hold your breath for 3-5 seconds or for as long as possible. Allow the piston or ball to fall to the bottom of the column. 7. Remove the mouthpiece from your mouth and breathe out normally. 8. Rest for a few seconds and repeat Steps 1 through 7 at least 10 times every 1-2 hours when you are awake. Take your time and take a few normal breaths between deep breaths. 9. The spirometer may include an indicator to show your best effort. Use the indicator as a goal to work toward during each repetition. 10. After each set of 10 deep breaths, practice coughing to be sure your lungs are clear. If you have an incision (the cut made at the time of surgery),  support your incision when coughing by placing a pillow or rolled up towels firmly against it. Once you are able to get out of bed, walk around indoors and cough well. You may stop using the incentive spirometer when instructed by your caregiver.  RISKS AND COMPLICATIONS  Take your time so you do  not get dizzy or light-headed.  If you are in pain, you may need to take or ask for pain medication before doing incentive spirometry. It is harder to take a deep breath if you are having pain. AFTER USE  Rest and breathe slowly and easily.  It can be helpful to keep track of a log of your progress. Your caregiver can provide you with a simple table to help with this. If you are using the spirometer at home, follow these instructions: [place] IF:   You are having difficultly using the spirometer.  You have trouble using the spirometer as often as instructed.  Your pain medication is not giving enough relief while using the spirometer.  You develop fever of 100.5 F (38.1 C) or higher. SEEK IMMEDIATE MEDICAL CARE IF:   You cough up bloody sputum that had not been present before.  You develop fever of 102 F (38.9 C) or greater.  You develop worsening pain at or near the incision site. MAKE SURE YOU:   Understand these instructions.  Will watch your condition.  Will get help right away if you are not doing well or get worse. Document Released: 03/14/2007 Document Revised: 01/24/2012 Document Reviewed: 05/15/2007 [employer] Patient Information 2014 [place], [place].   ________________________________________________________________________

## 2016-10-05 NOTE — Progress Notes (Signed)
Final EKG done 10/04/16-epic  

## 2016-10-08 ENCOUNTER — Inpatient Hospital Stay (HOSPITAL_COMMUNITY): Payer: Medicaid Other

## 2016-10-08 ENCOUNTER — Encounter (HOSPITAL_COMMUNITY)
Admission: RE | Disposition: A | Discharge status: Skilled Nursing Facility | Payer: Self-pay | Source: Ambulatory Visit | Attending: Orthopaedic Surgery

## 2016-10-08 ENCOUNTER — Inpatient Hospital Stay (HOSPITAL_COMMUNITY)
Admission: RE | Admit: 2016-10-08 | Discharge: 2016-10-11 | DRG: 470 | Disposition: A | Discharge status: Skilled Nursing Facility | Payer: Medicaid Other | Source: Ambulatory Visit | Attending: Orthopaedic Surgery | Admitting: Orthopaedic Surgery

## 2016-10-08 ENCOUNTER — Inpatient Hospital Stay (HOSPITAL_COMMUNITY): Payer: Medicaid Other | Admitting: Certified Registered Nurse Anesthetist

## 2016-10-08 ENCOUNTER — Encounter (HOSPITAL_COMMUNITY): Payer: Self-pay | Admitting: Anesthesiology

## 2016-10-08 DIAGNOSIS — E669 Obesity, unspecified: Secondary | ICD-10-CM | POA: Diagnosis present

## 2016-10-08 DIAGNOSIS — I10 Essential (primary) hypertension: Secondary | ICD-10-CM | POA: Diagnosis present

## 2016-10-08 DIAGNOSIS — Z880 Allergy status to penicillin: Secondary | ICD-10-CM

## 2016-10-08 DIAGNOSIS — M1612 Unilateral primary osteoarthritis, left hip: Secondary | ICD-10-CM | POA: Diagnosis present

## 2016-10-08 DIAGNOSIS — Z888 Allergy status to other drugs, medicaments and biological substances status: Secondary | ICD-10-CM

## 2016-10-08 DIAGNOSIS — Z6835 Body mass index (BMI) 35.0-35.9, adult: Secondary | ICD-10-CM | POA: Diagnosis not present

## 2016-10-08 DIAGNOSIS — M25552 Pain in left hip: Secondary | ICD-10-CM | POA: Diagnosis present

## 2016-10-08 DIAGNOSIS — Z419 Encounter for procedure for purposes other than remedying health state, unspecified: Secondary | ICD-10-CM

## 2016-10-08 DIAGNOSIS — Z96641 Presence of right artificial hip joint: Secondary | ICD-10-CM | POA: Diagnosis present

## 2016-10-08 DIAGNOSIS — Z7902 Long term (current) use of antithrombotics/antiplatelets: Secondary | ICD-10-CM | POA: Diagnosis not present

## 2016-10-08 DIAGNOSIS — Z96642 Presence of left artificial hip joint: Secondary | ICD-10-CM

## 2016-10-08 DIAGNOSIS — Z87891 Personal history of nicotine dependence: Secondary | ICD-10-CM | POA: Diagnosis not present

## 2016-10-08 HISTORY — PX: TOTAL HIP ARTHROPLASTY: SHX124

## 2016-10-08 LAB — TYPE AND SCREEN
ABO/RH(D): B POS
Antibody Screen: NEGATIVE

## 2016-10-08 SURGERY — ARTHROPLASTY, HIP, TOTAL, ANTERIOR APPROACH
Anesthesia: General | Laterality: Left

## 2016-10-08 MED ORDER — MEPERIDINE HCL 50 MG/ML IJ SOLN: 6.2500 mg | INTRAMUSCULAR | Status: DC | PRN

## 2016-10-08 MED ORDER — 0.9 % SODIUM CHLORIDE (POUR BTL) OPTIME
TOPICAL | Status: DC | PRN
  Administered 2016-10-08: 1000 mL

## 2016-10-08 MED ORDER — DEXAMETHASONE SODIUM PHOSPHATE 10 MG/ML IJ SOLN
INTRAMUSCULAR | Status: AC
  Filled 2016-10-08: qty 1

## 2016-10-08 MED ORDER — FENTANYL CITRATE (PF) 100 MCG/2ML IJ SOLN
INTRAMUSCULAR | Status: AC
  Filled 2016-10-08: qty 2

## 2016-10-08 MED ORDER — GABAPENTIN 300 MG PO CAPS
300.0000 mg | ORAL_CAPSULE | Freq: Three times a day (TID) | ORAL | Status: DC
  Administered 2016-10-08 – 2016-10-11 (×10): 300 mg via ORAL
  Filled 2016-10-08 (×11): qty 1

## 2016-10-08 MED ORDER — ONDANSETRON HCL 4 MG/2ML IJ SOLN
INTRAMUSCULAR | Status: AC
  Filled 2016-10-08: qty 2

## 2016-10-08 MED ORDER — SUCCINYLCHOLINE CHLORIDE 200 MG/10ML IV SOSY
PREFILLED_SYRINGE | INTRAVENOUS | Status: DC | PRN
  Administered 2016-10-08: 120 mg via INTRAVENOUS

## 2016-10-08 MED ORDER — HYDROCHLOROTHIAZIDE 25 MG PO TABS
25.0000 mg | ORAL_TABLET | Freq: Every day | ORAL | Status: DC
  Administered 2016-10-09 – 2016-10-11 (×3): 25 mg via ORAL
  Filled 2016-10-08 (×3): qty 1

## 2016-10-08 MED ORDER — ACETAMINOPHEN 650 MG RE SUPP: 650.0000 mg | Freq: Four times a day (QID) | RECTAL | Status: DC | PRN

## 2016-10-08 MED ORDER — ROCURONIUM BROMIDE 10 MG/ML (PF) SYRINGE
PREFILLED_SYRINGE | INTRAVENOUS | Status: DC | PRN
  Administered 2016-10-08: 50 mg via INTRAVENOUS

## 2016-10-08 MED ORDER — METHOCARBAMOL 500 MG PO TABS
500.0000 mg | ORAL_TABLET | Freq: Four times a day (QID) | ORAL | Status: DC | PRN
  Administered 2016-10-09 – 2016-10-11 (×6): 500 mg via ORAL
  Filled 2016-10-08 (×7): qty 1

## 2016-10-08 MED ORDER — PROMETHAZINE HCL 25 MG/ML IJ SOLN: 6.2500 mg | INTRAMUSCULAR | Status: DC | PRN

## 2016-10-08 MED ORDER — HYDROMORPHONE HCL 1 MG/ML IJ SOLN
0.2500 mg | INTRAMUSCULAR | Status: DC | PRN
  Administered 2016-10-08 (×3): 0.5 mg via INTRAVENOUS

## 2016-10-08 MED ORDER — LACTATED RINGERS IV SOLN
INTRAVENOUS | Status: DC
  Administered 2016-10-08 (×2): via INTRAVENOUS

## 2016-10-08 MED ORDER — METOCLOPRAMIDE HCL 5 MG/ML IJ SOLN: 5.0000 mg | Freq: Three times a day (TID) | INTRAMUSCULAR | Status: DC | PRN

## 2016-10-08 MED ORDER — ASPIRIN EC 325 MG PO TBEC
325.0000 mg | DELAYED_RELEASE_TABLET | Freq: Every day | ORAL | Status: DC
  Administered 2016-10-09 – 2016-10-11 (×3): 325 mg via ORAL
  Filled 2016-10-08 (×3): qty 1

## 2016-10-08 MED ORDER — SUCCINYLCHOLINE CHLORIDE 200 MG/10ML IV SOSY
PREFILLED_SYRINGE | INTRAVENOUS | Status: AC
  Filled 2016-10-08: qty 10

## 2016-10-08 MED ORDER — ONDANSETRON HCL 4 MG PO TABS
4.0000 mg | ORAL_TABLET | Freq: Four times a day (QID) | ORAL | Status: DC | PRN
  Administered 2016-10-10: 4 mg via ORAL
  Filled 2016-10-08: qty 1

## 2016-10-08 MED ORDER — METOCLOPRAMIDE HCL 5 MG PO TABS: 5.0000 mg | ORAL_TABLET | Freq: Three times a day (TID) | ORAL | Status: DC | PRN

## 2016-10-08 MED ORDER — ACETAMINOPHEN 325 MG PO TABS: 650.0000 mg | ORAL_TABLET | Freq: Four times a day (QID) | ORAL | Status: DC | PRN

## 2016-10-08 MED ORDER — DIPHENHYDRAMINE HCL 12.5 MG/5ML PO ELIX: 12.5000 mg | ORAL_SOLUTION | ORAL | Status: DC | PRN

## 2016-10-08 MED ORDER — HYDROMORPHONE HCL 1 MG/ML IJ SOLN
1.0000 mg | INTRAMUSCULAR | Status: DC | PRN
  Administered 2016-10-08 – 2016-10-10 (×7): 1 mg via INTRAVENOUS
  Filled 2016-10-08 (×7): qty 1

## 2016-10-08 MED ORDER — LIDOCAINE 2% (20 MG/ML) 5 ML SYRINGE
INTRAMUSCULAR | Status: AC
  Filled 2016-10-08: qty 5

## 2016-10-08 MED ORDER — OXYCODONE HCL 5 MG PO TABS
5.0000 mg | ORAL_TABLET | ORAL | Status: DC | PRN
  Administered 2016-10-08 (×2): 10 mg via ORAL
  Administered 2016-10-09 – 2016-10-11 (×13): 15 mg via ORAL
  Filled 2016-10-08 (×3): qty 3
  Filled 2016-10-08: qty 2
  Filled 2016-10-08 (×9): qty 3
  Filled 2016-10-08: qty 2
  Filled 2016-10-08: qty 3

## 2016-10-08 MED ORDER — CLOPIDOGREL BISULFATE 75 MG PO TABS
75.0000 mg | ORAL_TABLET | Freq: Every day | ORAL | Status: DC
Start: 2016-10-08 — End: 2016-10-11
  Administered 2016-10-08 – 2016-10-11 (×4): 75 mg via ORAL
  Filled 2016-10-08 (×4): qty 1

## 2016-10-08 MED ORDER — DEXAMETHASONE SODIUM PHOSPHATE 10 MG/ML IJ SOLN
INTRAMUSCULAR | Status: DC | PRN
  Administered 2016-10-08: 10 mg via INTRAVENOUS

## 2016-10-08 MED ORDER — SUGAMMADEX SODIUM 200 MG/2ML IV SOLN
INTRAVENOUS | Status: DC | PRN
  Administered 2016-10-08: 200 mg via INTRAVENOUS

## 2016-10-08 MED ORDER — MIDAZOLAM HCL 2 MG/2ML IJ SOLN
INTRAMUSCULAR | Status: AC
  Filled 2016-10-08: qty 2

## 2016-10-08 MED ORDER — CLINDAMYCIN PHOSPHATE 600 MG/50ML IV SOLN
600.0000 mg | Freq: Four times a day (QID) | INTRAVENOUS | Status: AC
  Administered 2016-10-08 (×2): 600 mg via INTRAVENOUS
  Filled 2016-10-08 (×2): qty 50

## 2016-10-08 MED ORDER — ONDANSETRON HCL 4 MG/2ML IJ SOLN: 4.0000 mg | Freq: Four times a day (QID) | INTRAMUSCULAR | Status: DC | PRN

## 2016-10-08 MED ORDER — CHLORHEXIDINE GLUCONATE 4 % EX LIQD: 60.0000 mL | Freq: Once | CUTANEOUS | Status: DC

## 2016-10-08 MED ORDER — SODIUM CHLORIDE 0.9 % IV SOLN
INTRAVENOUS | Status: DC
  Administered 2016-10-08: 16:00:00 via INTRAVENOUS

## 2016-10-08 MED ORDER — PROPOFOL 10 MG/ML IV BOLUS
INTRAVENOUS | Status: AC
  Filled 2016-10-08: qty 20

## 2016-10-08 MED ORDER — ALUM & MAG HYDROXIDE-SIMETH 200-200-20 MG/5ML PO SUSP: 30.0000 mL | ORAL | Status: DC | PRN

## 2016-10-08 MED ORDER — PHENOL 1.4 % MT LIQD
1.0000 | OROMUCOSAL | Status: DC | PRN
Start: 2016-10-08 — End: 2016-10-11
  Filled 2016-10-08: qty 177

## 2016-10-08 MED ORDER — FENTANYL CITRATE (PF) 100 MCG/2ML IJ SOLN
INTRAMUSCULAR | Status: AC
  Filled 2016-10-08: qty 4

## 2016-10-08 MED ORDER — HYDROMORPHONE HCL 1 MG/ML IJ SOLN
INTRAMUSCULAR | Status: AC
  Administered 2016-10-09: 1 mg via INTRAVENOUS
  Filled 2016-10-08: qty 1

## 2016-10-08 MED ORDER — MIDAZOLAM HCL 2 MG/2ML IJ SOLN
INTRAMUSCULAR | Status: DC | PRN
  Administered 2016-10-08: 2 mg via INTRAVENOUS

## 2016-10-08 MED ORDER — PHENYLEPHRINE 40 MCG/ML (10ML) SYRINGE FOR IV PUSH (FOR BLOOD PRESSURE SUPPORT)
PREFILLED_SYRINGE | INTRAVENOUS | Status: AC
  Filled 2016-10-08: qty 10

## 2016-10-08 MED ORDER — TRANEXAMIC ACID 1000 MG/10ML IV SOLN: 1000.0000 mg | INTRAVENOUS | Status: DC

## 2016-10-08 MED ORDER — FENTANYL CITRATE (PF) 100 MCG/2ML IJ SOLN
INTRAMUSCULAR | Status: DC | PRN
  Administered 2016-10-08 (×6): 50 ug via INTRAVENOUS
  Administered 2016-10-08: 100 ug via INTRAVENOUS

## 2016-10-08 MED ORDER — ROCURONIUM BROMIDE 50 MG/5ML IV SOSY
PREFILLED_SYRINGE | INTRAVENOUS | Status: AC
  Filled 2016-10-08: qty 5

## 2016-10-08 MED ORDER — LIDOCAINE 2% (20 MG/ML) 5 ML SYRINGE
INTRAMUSCULAR | Status: DC | PRN
  Administered 2016-10-08: 100 mg via INTRAVENOUS

## 2016-10-08 MED ORDER — HYDROMORPHONE HCL 1 MG/ML IJ SOLN: 0.2500 mg | INTRAMUSCULAR | Status: DC | PRN

## 2016-10-08 MED ORDER — DOCUSATE SODIUM 100 MG PO CAPS
100.0000 mg | ORAL_CAPSULE | Freq: Two times a day (BID) | ORAL | Status: DC
  Administered 2016-10-08 – 2016-10-11 (×6): 100 mg via ORAL
  Filled 2016-10-08 (×7): qty 1

## 2016-10-08 MED ORDER — DEXTROSE 5 % IV SOLN
500.0000 mg | Freq: Four times a day (QID) | INTRAVENOUS | Status: DC | PRN
  Administered 2016-10-08: 500 mg via INTRAVENOUS
  Filled 2016-10-08: qty 5
  Filled 2016-10-08: qty 550

## 2016-10-08 MED ORDER — MENTHOL 3 MG MT LOZG: 1.0000 | LOZENGE | OROMUCOSAL | Status: DC | PRN

## 2016-10-08 MED ORDER — SODIUM CHLORIDE 0.9 % IR SOLN
Status: DC | PRN
  Administered 2016-10-08: 1000 mL

## 2016-10-08 MED ORDER — CLINDAMYCIN PHOSPHATE 900 MG/50ML IV SOLN
900.0000 mg | INTRAVENOUS | Status: AC
  Administered 2016-10-08: 900 mg via INTRAVENOUS

## 2016-10-08 MED ORDER — HYDROMORPHONE HCL 1 MG/ML IJ SOLN
INTRAMUSCULAR | Status: AC
  Filled 2016-10-08: qty 1

## 2016-10-08 MED ORDER — PROPOFOL 10 MG/ML IV BOLUS
INTRAVENOUS | Status: DC | PRN
  Administered 2016-10-08: 150 mg via INTRAVENOUS

## 2016-10-08 MED ORDER — PHENYLEPHRINE HCL 10 MG/ML IJ SOLN
INTRAMUSCULAR | Status: DC | PRN
  Administered 2016-10-08 (×3): 80 ug via INTRAVENOUS
  Administered 2016-10-08: 40 ug via INTRAVENOUS
  Administered 2016-10-08 (×2): 80 ug via INTRAVENOUS

## 2016-10-08 MED ORDER — CLINDAMYCIN PHOSPHATE 900 MG/50ML IV SOLN
INTRAVENOUS | Status: AC
  Filled 2016-10-08: qty 50

## 2016-10-08 SURGICAL SUPPLY — 35 items
BAG SPEC THK2 15X12 ZIP CLS (MISCELLANEOUS) ×1
BAG ZIPLOCK 12X15 (MISCELLANEOUS) ×2 IMPLANT
BENZOIN TINCTURE PRP APPL 2/3 (GAUZE/BANDAGES/DRESSINGS) IMPLANT
BLADE SAW SGTL 18X1.27X75 (BLADE) ×2 IMPLANT
CAPT HIP TOTAL 2 ×2 IMPLANT
CELLS DAT CNTRL 66122 CELL SVR (MISCELLANEOUS) ×1 IMPLANT
CLOTH BEACON ORANGE TIMEOUT ST (SAFETY) ×2 IMPLANT
DRAPE STERI IOBAN 125X83 (DRAPES) ×2 IMPLANT
DRAPE U-SHAPE 47X51 STRL (DRAPES) ×6 IMPLANT
DRSG AQUACEL AG ADV 3.5X10 (GAUZE/BANDAGES/DRESSINGS) ×2 IMPLANT
DURAPREP 26ML APPLICATOR (WOUND CARE) ×2 IMPLANT
ELECT REM PT RETURN 9FT ADLT (ELECTROSURGICAL) ×2
ELECTRODE REM PT RTRN 9FT ADLT (ELECTROSURGICAL) ×1 IMPLANT
GAUZE XEROFORM 1X8 LF (GAUZE/BANDAGES/DRESSINGS) ×2 IMPLANT
GLOVE BIO SURGEON STRL SZ7.5 (GLOVE) ×2 IMPLANT
GLOVE BIOGEL PI IND STRL 8 (GLOVE) ×2 IMPLANT
GLOVE BIOGEL PI INDICATOR 8 (GLOVE) ×2
GLOVE ECLIPSE 8.0 STRL XLNG CF (GLOVE) ×2 IMPLANT
GOWN STRL REUS W/TWL XL LVL3 (GOWN DISPOSABLE) ×4 IMPLANT
HANDPIECE INTERPULSE COAX TIP (DISPOSABLE) ×1
HOLDER FOLEY CATH W/STRAP (MISCELLANEOUS) ×2 IMPLANT
PACK ANTERIOR HIP CUSTOM (KITS) ×2 IMPLANT
RTRCTR WOUND ALEXIS 18CM MED (MISCELLANEOUS) ×2
SET HNDPC FAN SPRY TIP SCT (DISPOSABLE) ×1 IMPLANT
STAPLER VISISTAT 35W (STAPLE) ×2 IMPLANT
STRIP CLOSURE SKIN 1/2X4 (GAUZE/BANDAGES/DRESSINGS) IMPLANT
SUT ETHIBOND NAB CT1 #1 30IN (SUTURE) ×2 IMPLANT
SUT MNCRL AB 4-0 PS2 18 (SUTURE) IMPLANT
SUT VIC AB 0 CT1 36 (SUTURE) ×2 IMPLANT
SUT VIC AB 1 CT1 36 (SUTURE) ×2 IMPLANT
SUT VIC AB 2-0 CT1 27 (SUTURE) ×4
SUT VIC AB 2-0 CT1 TAPERPNT 27 (SUTURE) ×2 IMPLANT
TRAY FOLEY W/METER SILVER 16FR (SET/KITS/TRAYS/PACK) IMPLANT
WATER STERILE IRR 1000ML POUR (IV SOLUTION) ×4 IMPLANT
YANKAUER SUCT BULB TIP 10FT TU (MISCELLANEOUS) ×2 IMPLANT

## 2016-10-08 NOTE — Anesthesia Procedure Notes (Signed)
Procedure Name: Intubation Date/Time: 10/08/2016 11:20 AM Performed by: Epimenio SarinJARVELA, Kenlynn Houde R Pre-anesthesia Checklist: Patient identified, Emergency Drugs available, Suction available, Patient being monitored and Timeout performed Patient Re-evaluated:Patient Re-evaluated prior to inductionOxygen Delivery Method: Circle system utilized Preoxygenation: Pre-oxygenation with 100% oxygen Intubation Type: IV induction, Cricoid Pressure applied and Rapid sequence Ventilation: Mask ventilation without difficulty and Oral airway inserted - appropriate to patient size Laryngoscope Size: Mac and 3 Grade View: Grade II Tube type: Oral Tube size: 7.5 mm Number of attempts: 1 Airway Equipment and Method: Stylet Placement Confirmation: ETT inserted through vocal cords under direct vision,  positive ETCO2 and breath sounds checked- equal and bilateral Secured at: 23 cm Tube secured with: Tape Dental Injury: Teeth and Oropharynx as per pre-operative assessment  Comments: Grade 2 view with downward laryngeal pressure.

## 2016-10-08 NOTE — Op Note (Signed)
NAMMarland Kitchen:  Gwynne EdingerLLEN, Kemond            ACCOUNT NO.:  [id number]  MEDICAL RECORD NO.:  [id number]  LOCATION:  PERIO                        [facility]  PHYSICIAN:  Vanita PandaChristopher Y. Magnus IvanBlackman, M.D.DATE OF BIRTH:  Jan 16, 1956  DATE OF PROCEDURE:  10/08/2016 DATE OF DISCHARGE:                              OPERATIVE REPORT   PREOPERATIVE DIAGNOSIS:  Primary osteoarthritis and degenerative joint disease, left hip.  POSTOPERATIVE DIAGNOSIS:  Primary osteoarthritis and degenerative joint disease, left hip.  PROCEDURE:  Left total hip arthroplasty through direct anterior approach.  IMPLANTS:  DePuy Sector Gription acetabular component size 54, size 36+ 4 neutral polyethylene liner, a single screw, size 12 Corail femoral component with standard offset, size 36+ 8.5 ceramic hip ball.  SURGEON:  Vanita PandaChristopher Y. Magnus IvanBlackman, M.D.  ASSISTANT:  Richardean CanalGilbert Clark, PA-C.  ANESTHESIA:  General.  ANTIBIOTICS:  900 mg of IV clindamycin.  BLOOD LOSS:  Less than 300 mL.  COMPLICATIONS:  None.  INDICATIONS:  [name] is a 60 year old gentleman, well known to me. He has debilitating arthritis involving his left hip.  His x-ray showed complete loss of the joint space.  He had a previous right total hip arthroplasty done elsewhere years ago.  That right leg is significantly longer than the left leg as well.  His pain is daily and is detrimentally affected his activities of daily living, his quality of life and his mobility, and he walks with significant limp.  At this point, we have recommended an anterior total hip arthroplasty.  He understands the risk of acute blood loss anemia, nerve and vessel injury, fracture, infection, dislocation, and DVT.  He understands our goals are decreased pain, improved mobility and overall improved quality of life.  PROCEDURE DESCRIPTION:  After informed consent was obtained, appropriate left hip was marked.  He was brought to the operating room and general anesthesia  was obtained while he was on the stretcher.  Traction boots were placed on both of his feet.  After assessed his leg lengths and found again the significantly longer on the right, nonoperative side on the left side, we then placed him supine on the Hana fracture table with the perineal post in place and both legs in inline skeletal traction devices, but no traction applied.  His left operative hip was then prepped and draped with DuraPrep and sterile drapes.  A time-out was called and he was identified as correct patient and correct left hip. We then made an incision just inferior and posterior to the anterior superior iliac spine and carried this obliquely down the leg.  We dissected down the tensor fascia lata muscle.  The tensor fascia was then divided longitudinally, so we could proceed with a direct anterior approach to the hip.  We identified and cauterized the circumflex vessels and identified the hip capsule.  We opened up the hip capsule in an L-type format finding a moderate joint effusion and significant periarticular osteophytes.  We placed our Cobra retractors around the medial and lateral femoral neck and then made our femoral neck cut with an oscillating saw and completed this with an osteotome.  We placed a corkscrew guide in the femoral head and removed the femoral head in its entirety and  found to be completely devoid of cartilage.  We then removed periarticular osteophytes from around the acetabulum as well as remnants of the acetabular labrum.  I placed a bent Hohmann over the medial acetabular rim and then began reaming under direct visualization after removing remnants of the labrum.  The reaming was started with 43 and we went up to a size 54.  With all reamers under direct visualization and the last two reamers under direct fluoroscopy, so I could obtain my depth of reaming, my inclination and anteversion.  Once I was pleased with this, we placed the real DePuy  Sector Gription acetabular component size 54, a single screw, and then a 36+ 4 liner for that size acetabular component.  Attention was then turned to the femur. With the leg externally rotated to 120 degrees, extended and adducted, we were able to use a Mueller retractor medially and a Hohmann retractor behind the greater trochanter.  We released the lateral joint capsule and used a box cutting osteotome to enter the femoral canal and a rongeur to lateralize.  We then began broaching from a size 8 broach using the Corail broaching system up to a size 12.  With a size 12 in place, we trialed a standard offset femoral neck and a 36+ 1.5 hip ball reducing the acetabulum and it was stable, but I felt like we needed to get him up leg length wise to match his other side.  He has been short for almost a decade and now says tissues are contracted, but I felt we could get to least 8.5 hip ball.  We dislocated the hip and removed the trial components.  We were able to place the real Corail femoral component with standard offset, size 12 and a real 36+ 8.5 hip ball.  We brought the leg back over and up with traction and internal rotation reducing the pelvis.  When we were pleased with stability, we then irrigated the soft tissue with normal saline solution using pulsatile lavage.  We were not able to close any joint capsule.  We closed the tensor fascia with a running #1 Vicryl suture followed by 0 Vicryl in the deep tissue, 2-0 Vicryl in the subcutaneous tissue and interrupted staples on the skin.  Xeroform and Aquacel dressing were applied.  He was awakened, extubated and taken to the recovery room in stable condition.  All final counts were correct.  There were no complications noted.  Of note, Richardean CanalGilbert Clark, PA-C assisted in the entire case.  His assistance was crucial for facilitating all aspects of this case.     Vanita Pandahristopher Y. Magnus IvanBlackman, M.D.     CYB/MEDQ  D:  10/08/2016  T:   10/08/2016  Job:  [id number]

## 2016-10-08 NOTE — H&P (Signed)
TOTAL HIP ADMISSION H&P  Patient is admitted for left total hip arthroplasty.  Subjective:  Chief Complaint: left hip pain  HPI: Kyle Fleming, 60 y.o. male, has a history of pain and functional disability in the left hip(s) due to arthritis and patient has failed non-surgical conservative treatments for greater than 12 weeks to include NSAID's and/or analgesics, corticosteriod injections, flexibility and strengthening excercises, use of assistive devices, weight reduction as appropriate and activity modification.  Onset of symptoms was gradual starting 5 years ago with gradually worsening course since that time.The patient noted no past surgery on the left hip(s).  Patient currently rates pain in the left hip at 10 out of 10 with activity. Patient has night pain, worsening of pain with activity and weight bearing, trendelenberg gait, pain that interfers with activities of daily living, pain with passive range of motion and crepitus. Patient has evidence of subchondral cysts, subchondral sclerosis, periarticular osteophytes and joint space narrowing by imaging studies. This condition presents safety issues increasing the risk of falls.  There is no current active infection.  Patient Active Problem List   Diagnosis Date Noted  . Unilateral primary osteoarthritis, left hip 10/08/2016   Past Medical History:  Diagnosis Date  . Arthritis   . Hypertension   . Phlebitis    hx of in right leg - 2016     Past Surgical History:  Procedure Laterality Date  . JOINT REPLACEMENT  2003   right hip   . [facility] in [facility]     Prescriptions Prior to Admission  Medication Sig Dispense Refill Last Dose  . clopidogrel (PLAVIX) 75 MG tablet Take 75 mg by mouth daily.   Taking  . cyclobenzaprine (FLEXERIL) 5 MG tablet Take 1 tablet (5 mg total) by mouth 3 (three) times daily as needed. (Patient taking differently: Take 5 mg by mouth 3 (three) times daily as needed for muscle  spasms. ) 15 tablet 0 Taking  . enalapril (VASOTEC) 10 MG tablet Take 10 mg by mouth daily.   Taking  . hydrochlorothiazide (HYDRODIURIL) 25 MG tablet Take 25 mg by mouth daily.   Taking  . lovastatin (MEVACOR) 20 MG tablet Take 20 mg by mouth daily at 6 PM.    Taking   Allergies  Allergen Reactions  . Amitriptyline Other (See Comments)    Confusion, out of this world feeling  . Naproxen Other (See Comments)    Doesn't work for patient  . Penicillins Rash    Has patient had a PCN reaction causing immediate rash, facial/tongue/throat swelling, SOB or lightheadedness with hypotension: no Has patient had a PCN reaction causing severe rash involving mucus membranes or skin necrosis: no Has patient had a PCN reaction that required hospitalization no Has patient had a PCN reaction occurring within the last 10 years: no If all of the above answers are "NO", then may proceed with Cephalosporin use.    Social History  Substance Use Topics  . Smoking status: Former Smoker    Types: Cigars  . Smokeless tobacco: Never Used  . Alcohol use Yes     Comment: 3 beeers daily on weekends     No family history on file.   Review of Systems  Musculoskeletal: Positive for back pain, joint pain and neck pain.  All other systems reviewed and are negative.   Objective:  Physical Exam  Constitutional: He is oriented to person, place, and time. He appears well-developed and  well-nourished.  HENT:  Head: Normocephalic and atraumatic.  Eyes: EOM are normal. Pupils are equal, round, and reactive to light.  Neck: Normal range of motion. Neck supple.  Cardiovascular: Normal rate.   Respiratory: Effort normal and breath sounds normal.  GI: Soft. Bowel sounds are normal.  Musculoskeletal:       Left hip: He exhibits decreased range of motion, decreased strength, tenderness and bony tenderness.  Neurological: He is alert and oriented to person, place, and time.  Skin: Skin is warm and dry.  Psychiatric:  He has a normal mood and affect.    Vital signs in last 24 hours:    Labs:   There is no height or weight on file to calculate BMI.   Imaging Review Plain radiographs demonstrate severe degenerative joint disease of the left hip(s). The bone quality appears to be good for age and reported activity level.  Assessment/Plan:  End stage arthritis, left hip(s)  The patient history, physical examination, clinical judgement of the provider and imaging studies are consistent with end stage degenerative joint disease of the left hip(s) and total hip arthroplasty is deemed medically necessary. The treatment options including medical management, injection therapy, arthroscopy and arthroplasty were discussed at length. The risks and benefits of total hip arthroplasty were presented and reviewed. The risks due to aseptic loosening, infection, stiffness, dislocation/subluxation,  thromboembolic complications and other imponderables were discussed.  The patient acknowledged the explanation, agreed to proceed with the plan and consent was signed. Patient is being admitted for inpatient treatment for surgery, pain control, PT, OT, prophylactic antibiotics, VTE prophylaxis, progressive ambulation and ADL's and discharge planning.The patient is planning to be discharged to skilled nursing facility

## 2016-10-08 NOTE — Transfer of Care (Signed)
Immediate Anesthesia Transfer of Care Note  Patient: Kyle Fleming  Procedure(s) Performed: Procedure(s): LEFT TOTAL HIP ARTHROPLASTY ANTERIOR APPROACH (Left)  Patient Location: PACU  Anesthesia Type:General  Level of Consciousness:  sedated, patient cooperative and responds to stimulation  Airway & Oxygen Therapy:Patient Spontanous Breathing and Patient connected to face mask oxgen  Post-op Assessment:  Report given to PACU RN and Post -op Vital signs reviewed and stable  Post vital signs:  Reviewed and stable  Last Vitals:  Vitals:   10/08/16 0739  BP: (!) 148/85  Pulse: 92  Resp: 18  Temp: 36.4 C    Complications: No apparent anesthesia complications

## 2016-10-08 NOTE — Anesthesia Preprocedure Evaluation (Addendum)
Anesthesia Evaluation  Patient identified by MRN, date of birth, ID band Patient awake    Reviewed: Allergy & Precautions, NPO status , Patient's Chart, lab work & pertinent test results  Airway Mallampati: II  TM Distance: >3 FB Neck ROM: Full    Dental  (+) Partial Upper   Pulmonary former smoker,    Pulmonary exam normal breath sounds clear to auscultation       Cardiovascular hypertension, Pt. on medications + Peripheral Vascular Disease  Normal cardiovascular exam Rhythm:Regular Rate:Normal  Stents placed right leg- 2016 [facility], on plavix   Neuro/Psych negative neurological ROS  negative psych ROS   GI/Hepatic negative GI ROS, Neg liver ROS,   Endo/Other  Morbid obesityHyperlipidemia Obesity  Renal/GU negative Renal ROS  negative genitourinary   Musculoskeletal  (+) Arthritis , Osteoarthritis,  OA left hip   Abdominal (+) + obese,   Peds  Hematology Plavix- last dose 09/27/16 Stents in right leg   Anesthesia Other Findings   Reproductive/Obstetrics                            Chemistry      Component Value Date/Time   NA 137 10/04/2016 1238   K 3.9 10/04/2016 1238   CL 102 10/04/2016 1238   CO2 25 10/04/2016 1238   BUN 12 10/04/2016 1238   CREATININE 0.87 10/04/2016 1238      Component Value Date/Time   CALCIUM 9.8 10/04/2016 1238     Lab Results  Component Value Date   WBC 7.1 10/04/2016   HGB 14.3 10/04/2016   HCT 42.6 10/04/2016   MCV 93.0 10/04/2016   PLT 209 10/04/2016   EKG: normal sinus rhythm, LVH.  Anesthesia Physical Anesthesia Plan  ASA: III  Anesthesia Plan: General   Post-op Pain Management:    Induction: Intravenous  Airway Management Planned: Oral ETT  Additional Equipment:   Intra-op Plan:   Post-operative Plan: Extubation in OR  Informed Consent: I have reviewed the patients History and Physical, chart, labs and discussed the procedure  including the risks, benefits and alternatives for the proposed anesthesia with the patient or authorized representative who has indicated his/her understanding and acceptance.   Dental advisory given  Plan Discussed with: Anesthesiologist, CRNA and Surgeon  Anesthesia Plan Comments:        Anesthesia Quick Evaluation

## 2016-10-08 NOTE — Brief Op Note (Signed)
10/08/2016  12:34 PM  PATIENT:  Delorise ShinerBroaderick D Stanco  60 y.o. male  PRE-OPERATIVE DIAGNOSIS:  osteoarthritis left hip  POST-OPERATIVE DIAGNOSIS:  osteoarthritis left hip  PROCEDURE:  Procedure(s): LEFT TOTAL HIP ARTHROPLASTY ANTERIOR APPROACH (Left)  SURGEON:  Surgeon(s) and Role:    * Kathryne Hitchhristopher Y Lavalle Skoda, MD - Primary  PHYSICIAN ASSISTANT: Rexene EdisonGil Clark, PA-C  ANESTHESIA:   general  EBL:  Total I/O In: 1000 [I.V.:1000] Out: 300 [Blood:300]  COUNTS:  YES  PLAN OF CARE: Admit to inpatient   PATIENT DISPOSITION:  PACU - hemodynamically stable.   Delay start of Pharmacological VTE agent (>24hrs) due to surgical blood loss or risk of bleeding: no

## 2016-10-08 NOTE — Anesthesia Postprocedure Evaluation (Signed)
Anesthesia Post Note  Patient: Kyle Fleming  Procedure(s) Performed: Procedure(s) (LRB): LEFT TOTAL HIP ARTHROPLASTY ANTERIOR APPROACH (Left)  Patient location during evaluation: PACU Anesthesia Type: General Level of consciousness: awake and alert Pain management: pain level controlled Vital Signs Assessment: post-procedure vital signs reviewed and stable Respiratory status: spontaneous breathing, nonlabored ventilation, respiratory function stable and patient connected to nasal cannula oxygen Cardiovascular status: blood pressure returned to baseline and stable Postop Assessment: no signs of nausea or vomiting Anesthetic complications: no    Last Vitals:  Vitals:   10/08/16 1330 10/08/16 1345  BP: (!) 153/82 (!) 148/101  Pulse: 98 (!) 102  Resp: (!) 9 (!) 22  Temp:      Last Pain:  Vitals:   10/08/16 1400  TempSrc:   PainSc: 4                  Noelia Lenart A.

## 2016-10-09 LAB — CBC
HCT: 33.9 % — ABNORMAL LOW (ref 39.0–52.0)
Hemoglobin: 11.4 g/dL — ABNORMAL LOW (ref 13.0–17.0)
MCH: 31.5 pg (ref 26.0–34.0)
MCHC: 33.6 g/dL (ref 30.0–36.0)
MCV: 93.6 fL (ref 78.0–100.0)
PLATELETS: 185 10*3/uL (ref 150–400)
RBC: 3.62 MIL/uL — ABNORMAL LOW (ref 4.22–5.81)
RDW: 13.3 % (ref 11.5–15.5)
WBC: 7.1 10*3/uL (ref 4.0–10.5)

## 2016-10-09 LAB — BASIC METABOLIC PANEL
Anion gap: 7 (ref 5–15)
BUN: 10 mg/dL (ref 6–20)
CALCIUM: 8.8 mg/dL — AB (ref 8.9–10.3)
CO2: 25 mmol/L (ref 22–32)
CREATININE: 0.77 mg/dL (ref 0.61–1.24)
Chloride: 103 mmol/L (ref 101–111)
GFR calc Af Amer: >60 mL/min (ref >60)
GLUCOSE: 134 mg/dL — AB (ref 65–99)
Potassium: 4.1 mmol/L (ref 3.5–5.1)
Sodium: 135 mmol/L (ref 135–145)

## 2016-10-09 NOTE — Progress Notes (Signed)
Physical Therapy Treatment Patient Details Name: Kyle Fleming MRN: [id number] DOB: 1955/12/03 Today's Date: 10/09/2016    History of Present Illness s/p LEFT TOTAL HIP ARTHROPLASTY ANTERIOR APPROACH (Left)    PT Comments    Pt progressing well with mobility and very pleased with same  Follow Up Recommendations  SNF     Equipment Recommendations  None recommended by PT    Recommendations for Other Services OT consult     Precautions / Restrictions Precautions Precautions: Fall Restrictions Weight Bearing Restrictions: No Other Position/Activity Restrictions: WBAT    Mobility  Bed Mobility Overal bed mobility: Needs Assistance Bed Mobility: Supine to Sit;Sit to Supine     Supine to sit: Min assist Sit to supine: Min assist   General bed mobility comments: increased time and cues for sequence and use of R LE to self assist  Transfers Overall transfer level: Needs assistance Equipment used: Rolling walker (2 wheeled) Transfers: Sit to/from Stand Sit to Stand: Min assist;From elevated surface         General transfer comment: cues for LE management and use of UEs to self assist  Ambulation/Gait Ambulation/Gait assistance: Min assist Ambulation Distance (Feet): 74 Feet Assistive device: Rolling walker (2 wheeled) Gait Pattern/deviations: Step-to pattern;Decreased step length - right;Decreased step length - left;Shuffle;Trunk flexed Gait velocity: decr Gait velocity interpretation: Below normal speed for age/gender General Gait Details: Increased time and cues for sequence, posture and position from Rw   Stairs            Wheelchair Mobility    Modified Rankin (Stroke Patients Only)       Balance                                    Cognition Arousal/Alertness: Awake/alert Behavior During Therapy: WFL for tasks assessed/performed Overall Cognitive Status: Within Functional Limits for tasks assessed                      Exercises      General Comments        Pertinent Vitals/Pain Pain Assessment: 0-10 Pain Score: 9  (Faces 5/10) Pain Location: L hip with ambulation Pain Descriptors / Indicators: Aching;Sore Pain Intervention(s): Limited activity within patient's tolerance;Monitored during session;Premedicated before session;Ice applied    Home Living                      Prior Function            PT Goals (current goals can now be found in the care plan section) Acute Rehab PT Goals Patient Stated Goal: to go to rehab  PT Goal Formulation: With patient Potential to Achieve Goals: Good Progress towards PT goals: Progressing toward goals    Frequency    7X/week      PT Plan Current plan remains appropriate    Co-evaluation             End of Session Equipment Utilized During Treatment: Gait belt Activity Tolerance: Patient tolerated treatment well Patient left: in bed;with call bell/phone within reach     Time: 1702-1735 PT Time Calculation (min) (ACUTE ONLY): 33 min  Charges:  $Gait Training: 23-37 mins                    G Codes:      Kyle Fleming 10/09/2016, 5:48 PM

## 2016-10-09 NOTE — Progress Notes (Signed)
Occupational Therapy Evaluation Patient Details Name: Kyle Fleming MRN: [id number] DOB: February 22, 1956 Today's Date: 10/09/2016    History of Present Illness s/p LEFT TOTAL HIP ARTHROPLASTY ANTERIOR APPROACH (Left)   Clinical Impression   Patient presents to OT with decreased ADL independence due to the deficits listed below. He will benefit from skilled OT to maximize function and to facilitate a safe discharge. Recommend SNF for rehab as patient lives alone. OT will follow.    Follow Up Recommendations  SNF   Equipment Recommendations  3 in 1 bedside comode    Recommendations for Other Services       Precautions / Restrictions Precautions Precautions: Fall Restrictions Weight Bearing Restrictions: No Other Position/Activity Restrictions: WBAT      Mobility Bed Mobility Overal bed mobility: Needs Assistance Bed Mobility: Supine to Sit     Supine to sit: Min assist     General bed mobility comments: assist for LLE, cues for technique  Transfers Overall transfer level: Needs assistance Equipment used: Rolling walker (2 wheeled) Transfers: Sit to/from Stand Sit to Stand: Min assist;+2 safety/equipment              Balance                                            ADL Overall ADL's : Needs assistance/impaired Eating/Feeding: Independent;Sitting   Grooming: Set up;Sitting   Upper Body Bathing: Minimal assitance;Sitting   Lower Body Bathing: Moderate assistance;Sit to/from stand   Upper Body Dressing : Minimal assistance;Sitting   Lower Body Dressing: Moderate assistance;Sit to/from stand   Toilet Transfer: Minimal assistance;+2 for safety/equipment;BSC           Functional mobility during ADLs: Minimal assistance;+2 for safety/equipment;Rolling walker       Vision     Perception     Praxis      Pertinent Vitals/Pain Pain Assessment: 0-10 Pain Score: 9  Pain Location: L hip with ambulation Pain Descriptors  / Indicators: Aching;Sore Pain Intervention(s): Limited activity within patient's tolerance;Monitored during session;Premedicated before session;Ice applied     Hand Dominance     Extremity/Trunk Assessment Upper Extremity Assessment Upper Extremity Assessment: Overall WFL for tasks assessed   Lower Extremity Assessment Lower Extremity Assessment: Defer to PT evaluation   Cervical / Trunk Assessment Cervical / Trunk Assessment: Normal   Communication Communication Communication: No difficulties   Cognition Arousal/Alertness: Awake/alert Behavior During Therapy: WFL for tasks assessed/performed Overall Cognitive Status: Within Functional Limits for tasks assessed                     General Comments       Exercises       Shoulder Instructions      Home Living Family/patient expects to be discharged to:: Skilled nursing facility Living Arrangements: Alone                               Additional Comments: pt reports he is mostly w/c bound but w/c does not fit into bathroom so he carefully ambulates about 4-665ft from door of bathroom to toilet using walls and counters for support. Sponge bathes only.      Prior Functioning/Environment Level of Independence: Independent with assistive device(s)  OT Problem List: Decreased strength;Decreased activity tolerance;Decreased knowledge of use of DME or AE;Pain   OT Treatment/Interventions: Self-care/ADL training;DME and/or AE instruction;Therapeutic activities;Patient/family education    OT Goals(Current goals can be found in the care plan section) Acute Rehab OT Goals Patient Stated Goal: to go to rehab  OT Goal Formulation: With patient Time For Goal Achievement: 10/23/16 Potential to Achieve Goals: Good ADL Goals Pt Will Perform Lower Body Bathing: with min assist;sit to/from stand Pt Will Perform Lower Body Dressing: with min assist;sit to/from stand Pt Will Transfer to Toilet:  with supervision;bedside commode Pt Will Perform Toileting - Clothing Manipulation and hygiene: with supervision;sit to/from stand  OT Frequency: Min 2X/week   Barriers to D/C: Decreased caregiver support  lives alone       Co-evaluation              End of Session Equipment Utilized During Treatment: Rolling walker;Gait belt Nurse Communication: Mobility status  Activity Tolerance: Patient tolerated treatment well;Patient limited by pain Patient left: in chair;with call bell/phone within reach   Time: 1026-1052 OT Time Calculation (min): 26 min Charges:  OT General Charges $OT Visit: 1 Procedure OT Evaluation $OT Eval Low Complexity: 1 Procedure G-Codes:    Kyle Fleming A 10/09/2016, 11:06 AM

## 2016-10-09 NOTE — Progress Notes (Signed)
Subjective: 1 Day Post-Op Procedure(s) (LRB): LEFT TOTAL HIP ARTHROPLASTY ANTERIOR APPROACH (Left) Patient reports pain as moderate.    Objective: Vital signs in last 24 hours: Temp:  [97.8 F (36.6 C)-98.6 F (37 C)] 98 F (36.7 C) (11/25 0619) Pulse Rate:  [90-102] 93 (11/25 0619) Resp:  [9-22] 16 (11/25 0619) BP: (123-159)/(81-101) 132/81 (11/25 0619) SpO2:  [95 %-100 %] 97 % (11/25 0619) Weight:  [235 lb (106.6 kg)] 235 lb (106.6 kg) (11/24 1430)  Intake/Output from previous day: 11/24 0701 - 11/25 0700 In: 4190.3 [P.O.:1432; I.V.:2708.3; IV Piggyback:50] Out: 300 [Blood:300] Intake/Output this shift: No intake/output data recorded.   Recent Labs  10/09/16 0432  HGB 11.4*    Recent Labs  10/09/16 0432  WBC 7.1  RBC 3.62*  HCT 33.9*  PLT 185    Recent Labs  10/09/16 0432  NA 135  K 4.1  CL 103  CO2 25  BUN 10  CREATININE 0.77  GLUCOSE 134*  CALCIUM 8.8*   No results for input(s): LABPT, INR in the last 72 hours.  Sensation intact distally Intact pulses distally Dorsiflexion/Plantar flexion intact Incision: dressing C/D/I  Assessment/Plan: 1 Day Post-Op Procedure(s) (LRB): LEFT TOTAL HIP ARTHROPLASTY ANTERIOR APPROACH (Left) Up with therapy Discharge to SNF due to home alone with no assistance  Kathryne HitchChristopher Y Sidonie Dexheimer 10/09/2016, 9:39 AM

## 2016-10-09 NOTE — Evaluation (Signed)
Physical Therapy Evaluation Patient Details Name: Kyle Fleming MRN: [id number] DOB: 05/16/1956 Today's Date: 10/09/2016   History of Present Illness  s/p LEFT TOTAL HIP ARTHROPLASTY ANTERIOR APPROACH (Left)  Clinical Impression  Pt s/p L THR presents with decreased L LE strength/ROM and post op pain limiting functional mobility.  Pt would greatly benefit from follow up rehab at SNF level to maximize IND and safety prior to return home alone.    Follow Up Recommendations SNF    Equipment Recommendations  None recommended by PT    Recommendations for Other Services OT consult     Precautions / Restrictions Precautions Precautions: Fall Restrictions Weight Bearing Restrictions: No Other Position/Activity Restrictions: WBAT      Mobility  Bed Mobility Overal bed mobility: Needs Assistance Bed Mobility: Supine to Sit     Supine to sit: Min assist     General bed mobility comments: assist for LLE, cues for technique  Transfers Overall transfer level: Needs assistance Equipment used: Rolling walker (2 wheeled) Transfers: Sit to/from Stand Sit to Stand: Min assist;+2 safety/equipment         General transfer comment: cues for LE management and use of UEs to self assist  Ambulation/Gait Ambulation/Gait assistance: Min assist;+2 safety/equipment Ambulation Distance (Feet): 21 Feet Assistive device: Rolling walker (2 wheeled) Gait Pattern/deviations: Step-to pattern;Decreased step length - right;Decreased step length - left;Shuffle;Trunk flexed Gait velocity: decr Gait velocity interpretation: Below normal speed for age/gender General Gait Details: cues for sequence, posture and position from Rw  Stairs            Wheelchair Mobility    Modified Rankin (Stroke Patients Only)       Balance                                             Pertinent Vitals/Pain Pain Assessment: 0-10 Pain Score: 9  Pain Location: L hip with  ambulation Pain Descriptors / Indicators: Aching;Sore Pain Intervention(s): Limited activity within patient's tolerance;Monitored during session;Premedicated before session;Ice applied    Home Living Family/patient expects to be discharged to:: Skilled nursing facility Living Arrangements: Alone               Additional Comments: pt reports he is mostly w/c bound but w/c does not fit into bathroom so he carefully ambulates about 4-715ft from door of bathroom to toilet using walls and counters for support. Sponge bathes only.    Prior Function Level of Independence: Independent with assistive device(s)               Hand Dominance        Extremity/Trunk Assessment   Upper Extremity Assessment: Overall WFL for tasks assessed           Lower Extremity Assessment: RLE deficits/detail;LLE deficits/detail RLE Deficits / Details: Knee to -10 ext 2* OA changes LLE Deficits / Details: Strength at hip 2+/5 with AAROM at a hip to 80 flex and 20 abd  Cervical / Trunk Assessment: Normal  Communication   Communication: No difficulties  Cognition Arousal/Alertness: Awake/alert Behavior During Therapy: WFL for tasks assessed/performed Overall Cognitive Status: Within Functional Limits for tasks assessed                      General Comments      Exercises Total Joint Exercises Ankle Circles/Pumps: AROM;Both;15 reps;Supine Quad Sets: AROM;Both;10 reps;Supine  Heel Slides: AAROM;20 reps;Supine;Left Hip ABduction/ADduction: AAROM;Left;15 reps;Supine   Assessment/Plan    PT Assessment Patient needs continued PT services  PT Problem List Decreased strength;Decreased range of motion;Decreased activity tolerance;Decreased mobility;Decreased knowledge of use of DME;Pain          PT Treatment Interventions DME instruction;Gait training;Stair training;Functional mobility training;Therapeutic activities;Therapeutic exercise;Patient/family education    PT Goals (Current  goals can be found in the Care Plan section)  Acute Rehab PT Goals Patient Stated Goal: to go to rehab  PT Goal Formulation: With patient Potential to Achieve Goals: Good    Frequency 7X/week   Barriers to discharge        Co-evaluation PT/OT/SLP Co-Evaluation/Treatment: Yes Reason for Co-Treatment: For patient/therapist safety PT goals addressed during session: Mobility/safety with mobility OT goals addressed during session: ADL's and self-care       End of Session Equipment Utilized During Treatment: Gait belt Activity Tolerance: Patient tolerated treatment well Patient left: in chair;with call bell/phone within reach;with family/visitor present Nurse Communication: Mobility status         Time: 1010-1048 PT Time Calculation (min) (ACUTE ONLY): 38 min   Charges:   PT Evaluation $PT Eval Low Complexity: 1 Procedure PT Treatments $Therapeutic Exercise: 8-22 mins   PT G Codes:        Lauryl Seyer 10/09/2016, 12:49 PM

## 2016-10-10 NOTE — Progress Notes (Signed)
Physical Therapy Treatment Patient Details Name: Kyle Fleming MRN: [id number] DOB: 1956/07/23 Today's Date: 10/10/2016    History of Present Illness s/p LEFT TOTAL HIP ARTHROPLASTY ANTERIOR APPROACH (Left)    PT Comments    POD # 2 progressing slowly due to pain Assisted OOB with increased time and amb in hallway a decreased distance due to pain and fatigue.  Positioned in recliner and applied ICE to hip.   Follow Up Recommendations  SNF (LPT rec SNF however SW states pt has no Insurance so plans are to D/C to home)  So she ordered equipment RW and 3:1 and HH     Equipment Recommendations       Recommendations for Other Services       Precautions / Restrictions Precautions Precautions: Fall Restrictions Weight Bearing Restrictions: No Other Position/Activity Restrictions: WBAT    Mobility  Bed Mobility Overal bed mobility: Needs Assistance Bed Mobility: Supine to Sit     Supine to sit: Min assist;Mod assist     General bed mobility comments: increased time and cues for sequence and use of R LE to self assist  Transfers Overall transfer level: Needs assistance Equipment used: Rolling walker (2 wheeled) Transfers: Sit to/from Stand Sit to Stand: Min assist;Mod assist;From elevated surface         General transfer comment: cues for LE management and use of UEs to self assist  Ambulation/Gait Ambulation/Gait assistance: Min assist Ambulation Distance (Feet): 38 Feet Assistive device: Rolling walker (2 wheeled)   Gait velocity: decr   General Gait Details: Increased time and cues for sequence, posture and position from Rw.  slow moving   Stairs            Wheelchair Mobility    Modified Rankin (Stroke Patients Only)       Balance                                    Cognition Arousal/Alertness: Awake/alert Behavior During Therapy: WFL for tasks assessed/performed Overall Cognitive Status: Within Functional Limits for  tasks assessed                      Exercises      General Comments        Pertinent Vitals/Pain Pain Assessment: 0-10 Pain Score: 10-Worst pain ever Pain Location: despite pre med still 10/10 Pain Descriptors / Indicators: Operative site guarding;Discomfort;Aching;Tender;Sore Pain Intervention(s): Monitored during session;Relaxation;Repositioned    Home Living                      Prior Function            PT Goals (current goals can now be found in the care plan section) Progress towards PT goals: Progressing toward goals    Frequency    7X/week      PT Plan Current plan remains appropriate    Co-evaluation             End of Session Equipment Utilized During Treatment: Gait belt Activity Tolerance: Patient limited by pain Patient left: in chair;with call bell/phone within reach     Time: 1159-1222 PT Time Calculation (min) (ACUTE ONLY): 23 min  Charges:  $Gait Training: 8-22 mins $Therapeutic Activity: 8-22 mins                    G Codes:  Rica Koyanagi  PTA [facility]  Acute  Rehab Pager      [phone]
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
## 2016-10-10 NOTE — NC FL2 (Signed)
[place] MEDICAID FL2 LEVEL OF CARE SCREENING TOOL     IDENTIFICATION  Patient Name: Kyle Fleming Birthdate: 1956/09/19 Sex: male Admission Date (Current Location): 10/08/2016  [facility] [place]   Facility and Address:  [facility],  [address], [place] [id number]      Provider Number: [id number]  Attending Physician Name and Address:  Kathryne Hitchhristopher Y Blackman, *  Relative Name and Phone Number:       Current Level of Care: Hospital Recommended Level of Care: Skilled Nursing Facility Prior Approval Number:    Date Approved/Denied:   PASRR Number: [id number]  Discharge Plan: SNF    Current Diagnoses: Patient Active Problem List   Diagnosis Date Noted  . Unilateral primary osteoarthritis, left hip 10/08/2016  . Status post left hip replacement 10/08/2016    Orientation RESPIRATION BLADDER Height & Weight     Time, Self, Situation, Place  O2 (2L) Continent Weight: 106.6 kg (235 lb) Height:  5\' 8"  (172.7 cm)  BEHAVIORAL SYMPTOMS/MOOD NEUROLOGICAL BOWEL NUTRITION STATUS   (NONE)  (NONE) Continent Diet (Regular)  AMBULATORY STATUS COMMUNICATION OF NEEDS Skin   Limited Assist Verbally Surgical wounds (Left hip)                       Personal Care Assistance Level of Assistance  Bathing, Feeding, Dressing Bathing Assistance: Limited assistance Feeding assistance: Independent Dressing Assistance: Limited assistance     Functional Limitations Info  Sight, Hearing, Speech Sight Info: Adequate Hearing Info: Adequate Speech Info: Adequate    SPECIAL CARE FACTORS FREQUENCY  PT (By licensed PT), OT (By licensed OT)     PT Frequency: 5 OT Frequency: 5            Contractures Contractures Info: Not present    Additional Factors Info  Allergies, Code Status Code Status Info: Full Allergies Info: Amitriptyline, Naproxen, Penicillins           Current Medications (10/10/2016):  This is the current  hospital active medication list Current Facility-Administered Medications  Medication Dose Route Frequency Provider Last Rate Last Dose  . 0.9 %  sodium chloride infusion   Intravenous Continuous Kathryne Hitchhristopher Y Blackman, MD   Stopped at 10/09/16 1715  . acetaminophen (TYLENOL) tablet 650 mg  650 mg Oral Q6H PRN Kathryne Hitchhristopher Y Blackman, MD       Or  . acetaminophen (TYLENOL) suppository 650 mg  650 mg Rectal Q6H PRN Kathryne Hitchhristopher Y Blackman, MD      . alum & mag hydroxide-simeth (MAALOX/MYLANTA) 200-200-20 MG/5ML suspension 30 mL  30 mL Oral Q4H PRN Kathryne Hitchhristopher Y Blackman, MD      . aspirin EC tablet 325 mg  325 mg Oral Q breakfast Kathryne Hitchhristopher Y Blackman, MD   325 mg at 10/10/16 0818  . clopidogrel (PLAVIX) tablet 75 mg  75 mg Oral Daily Kathryne Hitchhristopher Y Blackman, MD   75 mg at 10/10/16 1059  . diphenhydrAMINE (BENADRYL) 12.5 MG/5ML elixir 12.5-25 mg  12.5-25 mg Oral Q4H PRN Kathryne Hitchhristopher Y Blackman, MD      . docusate sodium (COLACE) capsule 100 mg  100 mg Oral BID Kathryne Hitchhristopher Y Blackman, MD   100 mg at 10/10/16 1059  . gabapentin (NEURONTIN) capsule 300 mg  300 mg Oral TID Kathryne Hitchhristopher Y Blackman, MD   300 mg at 10/10/16 1059  . hydrochlorothiazide (HYDRODIURIL) tablet 25 mg  25 mg Oral Daily Kathryne Hitchhristopher Y Blackman, MD   25 mg at 10/10/16 1059  .  HYDROmorphone (DILAUDID) injection 1 mg  1 mg Intravenous Q2H PRN Kathryne Hitchhristopher Y Blackman, MD   1 mg at 10/10/16 1135  . menthol-cetylpyridinium (CEPACOL) lozenge 3 mg  1 lozenge Oral PRN Kathryne Hitchhristopher Y Blackman, MD       Or  . phenol (CHLORASEPTIC) mouth spray 1 spray  1 spray Mouth/Throat PRN Kathryne Hitchhristopher Y Blackman, MD      . methocarbamol (ROBAXIN) tablet 500 mg  500 mg Oral Q6H PRN Kathryne Hitchhristopher Y Blackman, MD   500 mg at 10/10/16 0825   Or  . methocarbamol (ROBAXIN) 500 mg in dextrose 5 % 50 mL IVPB  500 mg Intravenous Q6H PRN Kathryne Hitchhristopher Y Blackman, MD   500 mg at 10/08/16 1330  . metoCLOPramide (REGLAN) tablet 5-10 mg  5-10 mg Oral Q8H PRN Kathryne Hitchhristopher Y  Blackman, MD       Or  . metoCLOPramide (REGLAN) injection 5-10 mg  5-10 mg Intravenous Q8H PRN Kathryne Hitchhristopher Y Blackman, MD      . ondansetron [facility]ZOFRAN) tablet 4 mg  4 mg Oral Q6H PRN Kathryne Hitchhristopher Y Blackman, MD       Or  . ondansetron [facility](ZOFRAN) injection 4 mg  4 mg Intravenous Q6H PRN Kathryne Hitchhristopher Y Blackman, MD      . oxyCODONE (Oxy IR/ROXICODONE) immediate release tablet 5-15 mg  5-15 mg Oral Q3H PRN Kathryne Hitchhristopher Y Blackman, MD   15 mg at 10/10/16 0825     Discharge Medications: Please see discharge summary for a list of discharge medications.  Relevant Imaging Results:  Relevant Lab Results:   Additional Information SSN: [id number]  Venita LickCampbell, Nasiyah Laverdiere B, LCSW
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
## 2016-10-10 NOTE — Progress Notes (Signed)
Subjective: 2 Days Post-Op Procedure(s) (LRB): LEFT TOTAL HIP ARTHROPLASTY ANTERIOR APPROACH (Left) Patient reports pain as moderate.  Very slow mobility.  Objective: Vital signs in last 24 hours: Temp:  [97.8 F (36.6 C)-98.8 F (37.1 C)] 98.8 F (37.1 C) (11/26 0647) Pulse Rate:  [81-104] 100 (11/26 0647) Resp:  [15-16] 16 (11/26 0647) BP: (121-138)/(71-101) 130/71 (11/26 0647) SpO2:  [97 %-98 %] 97 % (11/26 0647)  Intake/Output from previous day: 11/25 0701 - 11/26 0700 In: 1923.8 [P.O.:1080; I.V.:843.8] Out: 1975 [Urine:1975] Intake/Output this shift: No intake/output data recorded.   Recent Labs  10/09/16 0432  HGB 11.4*    Recent Labs  10/09/16 0432  WBC 7.1  RBC 3.62*  HCT 33.9*  PLT 185    Recent Labs  10/09/16 0432  NA 135  K 4.1  CL 103  CO2 25  BUN 10  CREATININE 0.77  GLUCOSE 134*  CALCIUM 8.8*   No results for input(s): LABPT, INR in the last 72 hours.  Sensation intact distally Intact pulses distally Dorsiflexion/Plantar flexion intact Incision: scant drainage  Assessment/Plan: 2 Days Post-Op Procedure(s) (LRB): LEFT TOTAL HIP ARTHROPLASTY ANTERIOR APPROACH (Left) Up with therapy Discharge to SNF next 1-2 days.  Kathryne HitchChristopher Y Neleh Muldoon 10/10/2016, 9:26 AM

## 2016-10-10 NOTE — Clinical Social Work Placement (Signed)
   CLINICAL SOCIAL WORK PLACEMENT  NOTE  Date:  10/10/2016  Patient Details  Name: Kyle Fleming MRN: [id number] Date of Birth: 04/07/1956  Clinical Social Work is seeking post-discharge placement for this patient at the Skilled  Nursing Facility level of care (*CSW will initial, date and re-position this form in  chart as items are completed):  Yes   Patient/family provided with [facility] Clinical Social Work Department's list of facilities offering this level of care within the geographic area requested by the patient (or if unable, by the patient's family).  Yes   Patient/family informed of their freedom to choose among providers that offer the needed level of care, that participate in Medicare, Medicaid or managed care program needed by the patient, have an available bed and are willing to accept the patient.  Yes   Patient/family informed of [facility]'s ownership interest in [facility] and [facility], as well as of the fact that they are under no obligation to receive care at these facilities.  PASRR submitted to EDS on 10/10/16     PASRR number received on 10/10/16     Existing PASRR number confirmed on       FL2 transmitted to all facilities in geographic area requested by pt/family on 10/10/16     FL2 transmitted to all facilities within larger geographic area on       Patient informed that his/her managed care company has contracts with or will negotiate with certain facilities, including the following:            Patient/family informed of bed offers received.  Patient chooses bed at       Physician recommends and patient chooses bed at      Patient to be transferred to   on  .  Patient to be transferred to facility by       Patient family notified on   of transfer.  Name of family member notified:        PHYSICIAN Please prepare priority discharge summary, including medications, Please prepare prescriptions, Please sign FL2     Additional  Comment:    _______________________________________________ Venita Lickampbell, Fatoumata Albaugh B, LCSW 10/10/2016, 12:13 PM

## 2016-10-11 ENCOUNTER — Encounter (HOSPITAL_COMMUNITY): Payer: Self-pay | Admitting: Orthopaedic Surgery

## 2016-10-11 MED ORDER — CYCLOBENZAPRINE HCL 5 MG PO TABS: 5.0000 mg | ORAL_TABLET | Freq: Three times a day (TID) | ORAL | 0 refills | Status: DC | PRN

## 2016-10-11 MED ORDER — OXYCODONE-ACETAMINOPHEN 5-325 MG PO TABS: 1.0000 | ORAL_TABLET | ORAL | 0 refills | Status: DC | PRN

## 2016-10-11 MED ORDER — ASPIRIN 325 MG PO TBEC: 325.0000 mg | DELAYED_RELEASE_TABLET | Freq: Every day | ORAL | 0 refills | Status: AC

## 2016-10-11 NOTE — Discharge Instructions (Signed)

## 2016-10-11 NOTE — Progress Notes (Addendum)
CSW assisting with d/c planning. Pt reports that he has [place] insurance. Pt informed that medicaid will cover long term SNF placement ( 30 days or longer ). Pt declines staying at SNF for 30 days. Medicaid will not pay for placement less than 30 days.  Pt is aware medicaid does not cover HHPT. Pt continues to decline SNF placement. RNCM will assist with d/c planning needs.  Cori RazorJamie Jahvon Gosline LCSW [phone]  10:30 Spoke with Ms. Arne ClevelandLovett, from [employer], at pt's request. Ms. Arne ClevelandLovett confirmed pt had medicaid ( [id number] ) and  is homeless with a bed at local shelter. Ms. Arne ClevelandLovett reports that pt is worried about losing his bed at shelter if he goes to rehab for 30 days. Ms. Arne ClevelandLovett will speak with pt regarding rehab and get back to CSW.  Cori RazorJamie Melyssa Signor [phone]

## 2016-10-11 NOTE — Progress Notes (Signed)
10/11/16  1457  Called [facility] [phone]  [facility].

## 2016-10-11 NOTE — Progress Notes (Signed)
Physical Therapy Treatment Patient Details Name: Kyle Fleming MRN: [id number] DOB: 1956/01/03 Today's Date: 10/11/2016    History of Present Illness s/p LEFT TOTAL HIP ARTHROPLASTY ANTERIOR APPROACH (Left)    PT Comments    Progressing well today, incr self assist, incr amb distance; pt discussing D/C plan with CM upon PT departure  Follow Up Recommendations  No PT follow up (medicaid--no HHPT)     Equipment Recommendations   (has DME)    Recommendations for Other Services       Precautions / Restrictions Precautions Precautions: Fall Restrictions Weight Bearing Restrictions: No Other Position/Activity Restrictions: WBAT    Mobility  Bed Mobility Overal bed mobility: Needs Assistance Bed Mobility: Supine to Sit     Supine to sit: Supervision Sit to supine: Supervision   General bed mobility comments: increased time and cues for sequence and use of R LE to self assist  Transfers Overall transfer level: Needs assistance Equipment used: Rolling walker (2 wheeled) Transfers: Sit to/from Stand Sit to Stand: Supervision         General transfer comment: cues for LE management and use of UEs to self assist  Ambulation/Gait Ambulation/Gait assistance: Supervision;Min guard Ambulation Distance (Feet): 100 Feet Assistive device: Rolling walker (2 wheeled) Gait Pattern/deviations: Step-to pattern;Decreased step length - right;Decreased step length - left;Decreased weight shift to left Gait velocity: decr   General Gait Details: occasional cues for sequence, RW safety   Stairs            Wheelchair Mobility    Modified Rankin (Stroke Patients Only)       Balance                                    Cognition Arousal/Alertness: Awake/alert Behavior During Therapy: WFL for tasks assessed/performed;Flat affect Overall Cognitive Status: Within Functional Limits for tasks assessed                      Exercises       General Comments        Pertinent Vitals/Pain Pain Assessment: 0-10 Pain Score: 3  Pain Location: left hip Pain Descriptors / Indicators: Sore Pain Intervention(s): Limited activity within patient's tolerance;Monitored during session;Premedicated before session;Ice applied    Home Living                      Prior Function            PT Goals (current goals can now be found in the care plan section) Acute Rehab PT Goals PT Goal Formulation: With patient Potential to Achieve Goals: Good Progress towards PT goals: Progressing toward goals    Frequency    7X/week      PT Plan Current plan remains appropriate    Co-evaluation             End of Session Equipment Utilized During Treatment: Gait belt Activity Tolerance: Patient tolerated treatment well Patient left: in bed;with call bell/phone within reach;with nursing/sitter in room     Time: 0955-1020 PT Time Calculation (min) (ACUTE ONLY): 25 min  Charges:                       G CodesDrucilla Fleming:      Kyle Fleming 10/11/2016, 12:46 PM

## 2016-10-11 NOTE — Progress Notes (Signed)
Subjective: 3 Days Post-Op Procedure(s) (LRB): LEFT TOTAL HIP ARTHROPLASTY ANTERIOR APPROACH (Left) Patient reports pain as moderate.    Objective: Vital signs in last 24 hours: Temp:  [97.6 F (36.4 C)-99 F (37.2 C)] 98.8 F (37.1 C) (11/27 0505) Pulse Rate:  [100-108] 108 (11/27 0505) Resp:  [16-20] 16 (11/27 0505) BP: (134-145)/(76-88) 145/88 (11/27 0505) SpO2:  [94 %-98 %] 94 % (11/27 0505)  Intake/Output from previous day: 11/26 0701 - 11/27 0700 In: 744 [P.O.:744] Out: 3450 [Urine:3450] Intake/Output this shift: No intake/output data recorded.   Recent Labs  10/09/16 0432  HGB 11.4*    Recent Labs  10/09/16 0432  WBC 7.1  RBC 3.62*  HCT 33.9*  PLT 185    Recent Labs  10/09/16 0432  NA 135  K 4.1  CL 103  CO2 25  BUN 10  CREATININE 0.77  GLUCOSE 134*  CALCIUM 8.8*   No results for input(s): LABPT, INR in the last 72 hours.  Sensation intact distally Intact pulses distally Dorsiflexion/Plantar flexion intact Incision: scant drainage  Assessment/Plan: 3 Days Post-Op Procedure(s) (LRB): LEFT TOTAL HIP ARTHROPLASTY ANTERIOR APPROACH (Left) Up with therapy Discharge to SNF today.  Kyle Fleming 10/11/2016, 7:38 AM

## 2016-10-11 NOTE — Clinical Social Work Placement (Signed)
   CLINICAL SOCIAL WORK PLACEMENT  NOTE  Date:  10/11/2016  Patient Details  Name: Kyle Fleming MRN: [id number] Date of Birth: 10-06-1956  Clinical Social Work is seeking post-discharge placement for this patient at the Skilled  Nursing Facility level of care (*CSW will initial, date and re-position this form in  chart as items are completed):  Yes   Patient/family provided with [facility] Clinical Social Work Department's list of facilities offering this level of care within the geographic area requested by the patient (or if unable, by the patient's family).  Yes   Patient/family informed of their freedom to choose among providers that offer the needed level of care, that participate in Medicare, Medicaid or managed care program needed by the patient, have an available bed and are willing to accept the patient.  Yes   Patient/family informed of [facility]'s ownership interest in [facility] and [facility], as well as of the fact that they are under no obligation to receive care at these facilities.  PASRR submitted to EDS on 10/10/16     PASRR number received on 10/10/16     Existing PASRR number confirmed on       FL2 transmitted to all facilities in geographic area requested by pt/family on 10/10/16     FL2 transmitted to all facilities within larger geographic area on       Patient informed that his/her managed care company has contracts with or will negotiate with certain facilities, including the following:        Yes   Patient/family informed of bed offers received.  [facility]     Physician recommends and patient chooses bed at      Patient to be transferred to [facility] on 10/11/16.  Patient to be transferred to facility by [facility]     Patient family notified on 10/11/16 of transfer.  Name of family member notified:  Pt will contact family directly.     PHYSICIAN Please prepare  priority discharge summary, including medications, Please prepare prescriptions, Please sign FL2     Additional Comment: Pt is in agreement with d/c to [facility]. FC spoke with DSS ( Mrs. Donzetta SprungHolts ) to confirm medicaid #. Medicaid # will not be in system to confirm until tomorrow though medicaid will be retroactive back to Sept 1,2017. Medicaid # provided to SNF. Pt is in agreement with placement for 30 days for medicaid to cover placement. Pt has w/c in room which Tammy from [facility] will transport to SNF today / tomorrow. D/C Summary sent to SNF for review. Scripts included in d/c packet. [facility] transport needed. Medical necessity form completed. # for report provided to nsg. Ms. Arne ClevelandLovett from [employer] updated with d/c plan.    _______________________________________________ Royetta AsalHaidinger, Elfida Shimada Lee, LCSW  [phone] 10/11/2016, 3:21 PM

## 2016-10-11 NOTE — Discharge Summary (Signed)
Patient ID: Kyle Fleming MRN: [id number] DOB/AGE: [birth year] 60 y.o.  Admit date: 10/08/2016 Discharge date: 10/11/2016  Admission Diagnoses:  Principal Problem:   Unilateral primary osteoarthritis, left hip Active Problems:   Status post left hip replacement   Discharge Diagnoses:  Same  Past Medical History:  Diagnosis Date  . Arthritis   . Hypertension   . Phlebitis    hx of in right leg - 2016     Surgeries: Procedure(s): LEFT TOTAL HIP ARTHROPLASTY ANTERIOR APPROACH on 10/08/2016   Consultants:   Discharged Condition: Improved  Hospital Course: Kyle Fleming is an 60 y.o. male who was admitted 10/08/2016 for operative treatment ofUnilateral primary osteoarthritis, left hip. Patient has severe unremitting pain that affects sleep, daily activities, and work/hobbies. After pre-op clearance the patient was taken to the operating room on 10/08/2016 and underwent  Procedure(s): LEFT TOTAL HIP ARTHROPLASTY ANTERIOR APPROACH.    Patient was given perioperative antibiotics: Anti-infectives    Start     Dose/Rate Route Frequency Ordered Stop   10/08/16 1700  clindamycin (CLEOCIN) IVPB 600 mg     600 mg 100 mL/hr over 30 Minutes Intravenous Every 6 hours 10/08/16 1450 10/09/16 0028   10/08/16 0728  clindamycin (CLEOCIN) IVPB 900 mg     900 mg 100 mL/hr over 30 Minutes Intravenous On call to O.R. 10/08/16 0728 10/08/16 1152       Patient was given sequential compression devices, early ambulation, and chemoprophylaxis to prevent DVT.  Patient benefited maximally from hospital stay and there were no complications.    Recent vital signs: Patient Vitals for the past 24 hrs:  BP Temp Temp src Pulse Resp SpO2  10/11/16 0505 (!) 145/88 98.8 F (37.1 C) Oral (!) 108 16 94 %  10/10/16 2119 138/76 99 F (37.2 C) Oral 100 20 96 %  10/10/16 1422 134/88 97.6 F (36.4 C) Oral (!) 102 16 98 %     Recent laboratory studies:  Recent Labs  10/09/16 0432  WBC 7.1   HGB 11.4*  HCT 33.9*  PLT 185  NA 135  K 4.1  CL 103  CO2 25  BUN 10  CREATININE 0.77  GLUCOSE 134*  CALCIUM 8.8*     Discharge Medications:     Medication List    TAKE these medications   aspirin 325 MG EC tablet Take 1 tablet (325 mg total) by mouth daily with breakfast.   clopidogrel 75 MG tablet Commonly known as:  PLAVIX Take 75 mg by mouth daily.   cyclobenzaprine 5 MG tablet Commonly known as:  FLEXERIL Take 1 tablet (5 mg total) by mouth 3 (three) times daily as needed for muscle spasms.   enalapril 10 MG tablet Commonly known as:  VASOTEC Take 10 mg by mouth daily.   hydrochlorothiazide 25 MG tablet Commonly known as:  HYDRODIURIL Take 25 mg by mouth daily.   lovastatin 20 MG tablet Commonly known as:  MEVACOR Take 20 mg by mouth daily at 6 PM.   oxyCODONE-acetaminophen 5-325 MG tablet Commonly known as:  ROXICET Take 1-2 tablets by mouth every 4 (four) hours as needed.            Durable Medical Equipment        Start     Ordered   10/08/16 1451  DME Walker rolling  Once    Question:  Patient needs a walker to treat with the following condition  Answer:  Status post left hip replacement   10/08/16 1450  10/08/16 1451  DME 3 n 1  Once     10/08/16 1450      Diagnostic Studies: Dg Knee 2 Views Right  Result Date: 09/17/2016 CLINICAL DATA:  To recent falls.  Swelling and pain. EXAM: RIGHT KNEE - 1-2 VIEW COMPARISON:  September 08, 2016 FINDINGS: Severe degenerative changes in the lateral and patellofemoral compartments with a small joint effusion. No acute fractures are seen. IMPRESSION: Degenerative changes with small joint effusion. Electronically Signed   By: Gerome Samavid  Williams III M.D   On: 09/17/2016 12:05   Dg Hip Port Unilat With Pelvis 1v Left  Result Date: 10/08/2016 CLINICAL DATA:  Left hip replacement EXAM: DG HIP (WITH OR WITHOUT PELVIS) 1V PORT LEFT COMPARISON:  Intraoperative film same day FINDINGS: Two views of the left hip  submitted. There is left hip prosthesis with anatomic alignment. Right hip prosthesis with anatomic alignment. IMPRESSION: Left hip prosthesis with anatomic alignment.  She Electronically Signed   By: Natasha MeadLiviu  Pop M.D.   On: 10/08/2016 13:59   Dg Hip Operative Unilat W Or W/o Pelvis Left  Result Date: 10/08/2016 CLINICAL DATA:  Left hip prosthesis EXAM: OPERATIVE left HIP (WITH PELVIS IF PERFORMED) 2 VIEWS TECHNIQUE: Fluoroscopic spot image(s) were submitted for interpretation post-operatively. COMPARISON:  09/08/2016 FINDINGS: Two views of the left hip submitted. There is left hip prosthesis with anatomic alignment. IMPRESSION: Left hip prosthesis with anatomic alignment. Electronically Signed   By: Natasha MeadLiviu  Pop M.D.   On: 10/08/2016 12:36    Disposition: to skilled nursing  Discharge Instructions    Call MD / Call 911    Complete by:  As directed    If you experience chest pain or shortness of breath, CALL 911 and be transported to the hospital emergency room.  If you develope a fever above 101 F, pus (white drainage) or increased drainage or redness at the wound, or calf pain, call your surgeon's office.   Constipation Prevention    Complete by:  As directed    Drink plenty of fluids.  Prune juice may be helpful.  You may use a stool softener, such as Colace (over the counter) 100 mg twice a day.  Use MiraLax (over the counter) for constipation as needed.   Diet - low sodium heart healthy    Complete by:  As directed    Discharge patient    Complete by:  As directed    Increase activity slowly as tolerated    Complete by:  As directed       Follow-up Information    [facility] Follow up.   Specialty:  Home Health Services Why:  Home Health Physical Therapy Contact information: [address] [place] [address] [phone]        Kathryne Hitchhristopher Y Takari Duncombe, MD. Schedule an appointment as soon as possible for a visit in 2 week(s).   Specialty:  Orthopedic  Surgery Contact information: [address] [place] [address] [phone]            Signed: Kathryne HitchChristopher Y Javoni Lucken 10/11/2016, 7:41 AM

## 2016-10-11 NOTE — Progress Notes (Signed)
Occupational Therapy Treatment Patient Details Name: Kyle Fleming MRN: [id number] DOB: December 12, 1955 Today's Date: 10/11/2016    History of present illness s/p LEFT TOTAL HIP ARTHROPLASTY ANTERIOR APPROACH (Left)   OT comments  Dc plans are now SNF .   Follow Up Recommendations  SNF;Supervision/Assistance - 24 hour    Equipment Recommendations  3 in 1 bedside comode       Precautions / Restrictions Precautions Precautions: Fall Restrictions Weight Bearing Restrictions: No Other Position/Activity Restrictions: WBAT       Mobility Bed Mobility Overal bed mobility: Needs Assistance Bed Mobility: Supine to Sit     Supine to sit: Supervision Sit to supine: Supervision   General bed mobility comments: increased time and cues for sequence and use of R LE to self assist  Transfers Overall transfer level: Needs assistance Equipment used: Rolling walker (2 wheeled) Transfers: Sit to/from Stand Sit to Stand: Min assist         General transfer comment: cues for LE management and use of UEs to self assist        ADL           Upper Body Bathing: Set up;Sitting   Lower Body Bathing: Moderate assistance;Sit to/from stand;Cueing for safety;Cueing for sequencing       Lower Body Dressing: Moderate assistance;Sit to/from stand;Cueing for safety;Cueing for sequencing   Toilet Transfer: Minimal assistance;RW;Cueing for safety;Comfort height toilet;Cueing for sequencing           Functional mobility during ADLs: Minimal assistance;Rolling walker;Cueing for sequencing;Cueing for safety                  Cognition   Behavior During Therapy: [facility] for tasks assessed/performed;Flat affect Overall Cognitive Status: Within Functional Limits for tasks assessed                                    Pertinent Vitals/ Pain       Pain Assessment: 0-10 Pain Score: 3  Pain Location: L hip Pain Descriptors / Indicators: Sore Pain Intervention(s):  Repositioned;Limited activity within patient's tolerance         Frequency  Min 2X/week        Progress Toward Goals  OT Goals(current goals can now be found in the care plan section)  Progress towards OT goals: Progressing toward goals  Acute Rehab OT Goals Patient Stated Goal: to go to rehab   Plan Discharge plan remains appropriate       End of Session Equipment Utilized During Treatment: Rolling walker;Gait belt   Activity Tolerance Patient tolerated treatment well;Patient limited by pain   Patient Left in chair;with call bell/phone within reach   Nurse Communication Mobility status        Time: [id number] OT Time Calculation (min): 12 min  Charges: OT General Charges $OT Visit: 1 Procedure OT Treatments $Self Care/Home Management : 8-22 mins  Erick Murin, Karin GoldenLorraine D 10/11/2016, 2:41 PM

## 2016-10-11 NOTE — Progress Notes (Signed)
10/11/16  1510  Reviewed discharge instructions with patient. Patient verbalized understanding of discharge instructions. Copy of discharge instructions given to patient.

## 2016-10-12 ENCOUNTER — Encounter: Payer: Self-pay | Admitting: Adult Health

## 2016-10-12 ENCOUNTER — Non-Acute Institutional Stay (SKILLED_NURSING_FACILITY): Payer: Medicaid Other | Admitting: Adult Health

## 2016-10-12 DIAGNOSIS — M1612 Unilateral primary osteoarthritis, left hip: Secondary | ICD-10-CM

## 2016-10-12 DIAGNOSIS — I10 Essential (primary) hypertension: Secondary | ICD-10-CM

## 2016-10-12 DIAGNOSIS — Z8672 Personal history of thrombophlebitis: Secondary | ICD-10-CM

## 2016-10-12 DIAGNOSIS — Z96642 Presence of left artificial hip joint: Secondary | ICD-10-CM | POA: Diagnosis not present

## 2016-10-12 DIAGNOSIS — E785 Hyperlipidemia, unspecified: Secondary | ICD-10-CM | POA: Diagnosis not present

## 2016-10-12 NOTE — Progress Notes (Signed)
Patient ID: Kyle Fleming, male   DOB: Jul 30, 1956, 60 y.o.   MRN: [id number]   Location:   [facility] Room Number: 132-B Place of Service:  SNF (31)   CODE STATUS: Full Code until otherwise determined  Allergies  Allergen Reactions  . Amitriptyline Other (See Comments)    Confusion, out of this world feeling  . Naproxen Other (See Comments)    Doesn't work for patient  . Penicillins Rash    Has patient had a PCN reaction causing immediate rash, facial/tongue/throat swelling, SOB or lightheadedness with hypotension: no Has patient had a PCN reaction causing severe rash involving mucus membranes or skin necrosis: no Has patient had a PCN reaction that required hospitalization no Has patient had a PCN reaction occurring within the last 10 years: no If all of the above answers are "NO", then may proceed with Cephalosporin use.    Chief Complaint  Patient presents with  . Hospitalization Follow-up    Hospital Follow up    HPI:  He had been hospitalized for a left hip replacement. He is here for short term rehab with his goal to return back home. He is complaining of left hip pain which is not being adequate relief. There are no nursing concerns at this time.    Past Medical History:  Diagnosis Date  . Arthritis   . Hypertension   . Phlebitis    hx of in right leg - 2016     Past Surgical History:  Procedure Laterality Date  . JOINT REPLACEMENT  2003   right hip   . [facility]   . TOTAL HIP ARTHROPLASTY Left 10/08/2016   Procedure: LEFT TOTAL HIP ARTHROPLASTY ANTERIOR APPROACH;  Surgeon: Kathryne Hitchhristopher Y Blackman, MD;  Location: [facility]S;  Service: Orthopedics;  Laterality: Left;    Social History   Social History  . Marital status: Single    Spouse name: N/A  . Number of children: N/A  . Years of education: N/A   Occupational History  . Not on file.   Social History Main Topics  . Smoking status: Former Smoker   Types: Cigars  . Smokeless tobacco: Never Used  . Alcohol use Yes     Comment: 3 beeers daily on weekends   . Drug use: No  . Sexual activity: Not on file   Other Topics Concern  . Not on file   Social History Narrative  . No narrative on file   History reviewed. No pertinent family history.    VITAL SIGNS BP (!) 160/94   Pulse 86   Temp 97.6 F (36.4 C) (Oral)   Resp 18   Ht 5\' 8"  (1.727 m)   Wt 234 lb (106.1 kg)   SpO2 99%   BMI 35.58 kg/m   Patient's Medications  New Prescriptions   No medications on file  Previous Medications   ASPIRIN EC 325 MG EC TABLET    Take 1 tablet (325 mg total) by mouth daily with breakfast.   CLOPIDOGREL (PLAVIX) 75 MG TABLET    Take 75 mg by mouth daily.   CYCLOBENZAPRINE (FLEXERIL) 5 MG TABLET    Take 1 tablet (5 mg total) by mouth 3 (three) times daily as needed for muscle spasms.   ENALAPRIL (VASOTEC) 10 MG TABLET    Take 10 mg by mouth daily.   HYDROCHLOROTHIAZIDE (HYDRODIURIL) 25 MG TABLET    Take 25 mg by mouth daily.  LOVASTATIN (MEVACOR) 20 MG TABLET    Take 20 mg by mouth daily at 6 PM.    OXYCODONE-ACETAMINOPHEN (ROXICET) 5-325 MG TABLET    Take 1-2 tablets by mouth every 4 (four) hours as needed.  Modified Medications   No medications on file  Discontinued Medications   No medications on file     SIGNIFICANT DIAGNOSTIC EXAMS  10-08-16: left hip x-ray: Left hip prosthesis with anatomic alignment   LABS REVIEWED:   10-04-16; wbc 7.1; hgb 14.3; hct 42.6; mcv 93; plt 209; glucose 90; bun 12; creat 0.87; k+ 3.9; na++ 137 10-09-16: wbc 7.1; hgb 11.4; hct 33.9; mcv 93.6; plt 185; glucose 134; bun 10; creat 0.77; k+ 4.1 ;na++ 135   Review of Systems  Constitutional: Negative for malaise/fatigue.  Respiratory: Negative for cough and shortness of breath.   Cardiovascular: Negative for chest pain, palpitations and leg swelling.  Gastrointestinal: Negative for abdominal pain, constipation and heartburn.  Musculoskeletal:  Positive for joint pain. Negative for back pain and myalgias.       Has left hip pain   Skin: Negative.   Neurological: Negative for dizziness.  Psychiatric/Behavioral: The patient is not nervous/anxious.     Physical Exam  Constitutional: He is oriented to person, place, and time. He appears well-developed and well-nourished. No distress.  Obese   Eyes: Conjunctivae are normal.  Neck: Neck supple. No JVD present. No thyromegaly present.  Cardiovascular: Normal rate, regular rhythm and intact distal pulses.   Respiratory: Effort normal and breath sounds normal. No respiratory distress. He has no wheezes.  GI: Soft. Bowel sounds are normal. He exhibits no distension. There is no tenderness.  Musculoskeletal: He exhibits no edema.  Able to move all extremities  Is status post left hip replacement   Lymphadenopathy:    He has no cervical adenopathy.  Neurological: He is alert and oriented to person, place, and time.  Skin: Skin is warm and dry. He is not diaphoretic.  Left hip incision line without signs of infection present   Psychiatric: He has a normal mood and affect.       ASSESSMENT/ PLAN:  1. Left hip osteoarthritis: is status post left hip replacement; will follow up with orthopedics as indicated and will continue therapy as directed; will continue flexeril 5 mg three times daily as needed will change to oxycodone 10 or 15 mg every 4 hours as needed for pain   2. Hypertension: will continue vasotec 10 mg daily will continue hctz 25 mg daily   3. Dyslipidemia: will continue mevaor 20 mg daily   4. History of right leg phlebitis is status post right leg stent (2016); will continue plavix 75 mg daily and asa 325 mg daily   Time spent with patient  50  minutes >50% time spent counseling; reviewing medical record; tests; labs; and developing future plan of care    MD is aware of resident's narcotic use and is in agreement with current plan of care. We will attempt to wean  resident as apropriate   Synthia Innocenteborah Green NP [facility]  Contact [phone] Monday through Friday 8am- 5pm  After hours call [phone]

## 2016-10-14 ENCOUNTER — Non-Acute Institutional Stay (SKILLED_NURSING_FACILITY): Payer: Medicaid Other | Admitting: Internal Medicine

## 2016-10-14 ENCOUNTER — Telehealth (INDEPENDENT_AMBULATORY_CARE_PROVIDER_SITE_OTHER): Payer: Self-pay | Admitting: Radiology

## 2016-10-14 ENCOUNTER — Encounter: Payer: Self-pay | Admitting: Internal Medicine

## 2016-10-14 DIAGNOSIS — I10 Essential (primary) hypertension: Secondary | ICD-10-CM | POA: Diagnosis not present

## 2016-10-14 DIAGNOSIS — E785 Hyperlipidemia, unspecified: Secondary | ICD-10-CM

## 2016-10-14 DIAGNOSIS — Z96642 Presence of left artificial hip joint: Secondary | ICD-10-CM

## 2016-10-14 DIAGNOSIS — Z8672 Personal history of thrombophlebitis: Secondary | ICD-10-CM

## 2016-10-14 NOTE — Telephone Encounter (Signed)
Cherella from [facility] left message on machine states that Mr. Schryver had Total Hip Arthroplasty and she states that she noticed blood on surgical dressing. She states that she would like a call back advising her of what needs to be done with this.  Please call her back @ [phone].

## 2016-10-14 NOTE — Progress Notes (Signed)
Patient ID: Kyle Fleming, male   DOB: November 24, 1955, 60 y.o.   MRN: [id number]    HISTORY AND PHYSICAL   DATE: 10/14/2016  Location:    [place] Room Number: 132 B Place of Service: SNF (31)   Extended Emergency Contact Information Primary Emergency Contact: Game,Peggy Address: [address]          [place], [place] [address] [place] Phone: [phone] Relation: Mother  Advanced Directive information Does Patient Have a Medical Advance Directive?: No, Would patient like information on creating a medical advance directive?: No - Patient declined  Chief Complaint  Patient presents with  . New Admit To SNF    HPI:  60 yo male seen today as a new admission into SNF following hospital stay for left hip OA, HTN, right leg phlebitis. He underwent left THR 10/08/16 by Dr Rush Farmer. No immediate postop complications. He was given perioperative abx cleocin. Hgb 11.4; Cr 0.77 at d/c. He presents to SNF for short term rehab.  Today he reports left hip pain is controlled on flexeril 5 mg three times daily as needed and oxycodone 10 or 15 mg every 4 hours as needed. He is c/a housing status once he is d/c'd. He was living at [facility] prior to hospital admission and would like to return there. No other concerns. Wound care c/a left lateral thigh dsg bloody today. Pt denies falls or other trauma. Ortho has been notified. He is scheduled to see ortho next week  Hypertension - stable on vasotec 10 mg daily; HCTZ 25 mg daily   Dyslipidemia - takes mevaor 20 mg daily. No myalgias  History of right leg phlebitis - s/p right leg stent in 2016; takes plavix 75 mg daily and ASA 325 mg daily for anticoagulation  Past Medical History:  Diagnosis Date  . Arthritis   . Hypertension   . Phlebitis    hx of in right leg - 2016     Past Surgical History:  Procedure Laterality Date  . JOINT REPLACEMENT  2003   right hip   . stents placed in right leg      AT  [facility]- 2016   . TOTAL HIP ARTHROPLASTY Left 10/08/2016   Procedure: LEFT TOTAL HIP ARTHROPLASTY ANTERIOR APPROACH;  Surgeon: Mcarthur Rossetti, MD;  Location: [facility];  Service: Orthopedics;  Laterality: Left;    Patient Care Team: Provider Default, MD as PCP - General  Social History   Social History  . Marital status: Single    Spouse name: N/A  . Number of children: N/A  . Years of education: N/A   Occupational History  . Not on file.   Social History Main Topics  . Smoking status: Former Smoker    Types: Cigars  . Smokeless tobacco: Never Used  . Alcohol use Yes     Comment: 3 beeers daily on weekends   . Drug use: No  . Sexual activity: Not on file   Other Topics Concern  . Not on file   Social History Narrative  . No narrative on file     reports that he has quit smoking. His smoking use included Cigars. He has never used smokeless tobacco. He reports that he drinks alcohol. He reports that he does not use drugs.  History reviewed. No pertinent family history. No family status information on file.     There is no immunization history on file for this patient.  Allergies  Allergen Reactions  .  Amitriptyline Other (See Comments)    Confusion, out of this world feeling  . Naproxen Other (See Comments)    Doesn't work for patient  . Penicillins Rash    Has patient had a PCN reaction causing immediate rash, facial/tongue/throat swelling, SOB or lightheadedness with hypotension: no Has patient had a PCN reaction causing severe rash involving mucus membranes or skin necrosis: no Has patient had a PCN reaction that required hospitalization no Has patient had a PCN reaction occurring within the last 10 years: no If all of the above answers are "NO", then may proceed with Cephalosporin use.    Medications: Patient's Medications  New Prescriptions   No medications on file  Previous Medications   ASPIRIN EC 325 MG EC TABLET    Take 1 tablet (325 mg total) by  mouth daily with breakfast.   CLOPIDOGREL (PLAVIX) 75 MG TABLET    Take 75 mg by mouth daily.   CYCLOBENZAPRINE (FLEXERIL) 5 MG TABLET    Take 1 tablet (5 mg total) by mouth 3 (three) times daily as needed for muscle spasms.   ENALAPRIL (VASOTEC) 10 MG TABLET    Take 10 mg by mouth daily.   HYDROCHLOROTHIAZIDE (HYDRODIURIL) 25 MG TABLET    Take 25 mg by mouth daily.   LOVASTATIN (MEVACOR) 20 MG TABLET    Take 20 mg by mouth daily at 6 PM.    OXYCODONE-ACETAMINOPHEN (ROXICET) 5-325 MG TABLET    Take 1-2 tablets by mouth every 4 (four) hours as needed.  Modified Medications   No medications on file  Discontinued Medications   No medications on file    Review of Systems  Musculoskeletal: Positive for arthralgias and gait problem.  Skin: Positive for wound.  Neurological: Negative for numbness.  All other systems reviewed and are negative.   Vitals:   10/14/16 1017  BP: (!) 160/94  Pulse: 86  Resp: 18  Temp: 97.6 F (36.4 C)  TempSrc: Oral  SpO2: 99%  Weight: 234 lb (106.1 kg)  Height: '5\' 8"'$  (1.727 m)   Body mass index is 35.58 kg/m.  Physical Exam  Constitutional: He is oriented to person, place, and time. He appears well-developed and well-nourished.  HENT:  Mouth/Throat: Oropharynx is clear and moist.  Eyes: Pupils are equal, round, and reactive to light. No scleral icterus.  Neck: Neck supple. Carotid bruit is not present. No thyromegaly present.  Cardiovascular: Normal rate, regular rhythm, normal heart sounds and intact distal pulses.  Exam reveals no gallop and no friction rub.   No murmur heard. Trace LE edema b/l. No calf TTP  Pulmonary/Chest: Effort normal and breath sounds normal. He has no wheezes. He has no rales. He exhibits no tenderness.  Abdominal: Soft. Bowel sounds are normal. He exhibits no distension, no abdominal bruit, no pulsatile midline mass and no mass. There is no hepatomegaly. There is no tenderness. There is no rebound and no guarding.    Musculoskeletal: He exhibits edema and tenderness.  Left lateral thigh bandage is bloody. Min swelling. Slightly TTP. He has FROM toes  Lymphadenopathy:    He has no cervical adenopathy.  Neurological: He is alert and oriented to person, place, and time. He has normal reflexes.  Skin: Skin is warm and dry. No rash noted.  Psychiatric: He has a normal mood and affect. His behavior is normal. Judgment and thought content normal.     Labs reviewed: Admission on 10/08/2016, Discharged on 10/11/2016  Component Date Value Ref Range Status  .  WBC 10/09/2016 7.1  4.0 - 10.5 K/uL Final  . RBC 10/09/2016 3.62* 4.22 - 5.81 MIL/uL Final  . Hemoglobin 10/09/2016 11.4* 13.0 - 17.0 g/dL Final  . HCT 10/09/2016 33.9* 39.0 - 52.0 % Final  . MCV 10/09/2016 93.6  78.0 - 100.0 fL Final  . MCH 10/09/2016 31.5  26.0 - 34.0 pg Final  . MCHC 10/09/2016 33.6  30.0 - 36.0 g/dL Final  . RDW 10/09/2016 13.3  11.5 - 15.5 % Final  . Platelets 10/09/2016 185  150 - 400 K/uL Final  . Sodium 10/09/2016 135  135 - 145 mmol/L Final  . Potassium 10/09/2016 4.1  3.5 - 5.1 mmol/L Final  . Chloride 10/09/2016 103  101 - 111 mmol/L Final  . CO2 10/09/2016 25  22 - 32 mmol/L Final  . Glucose, Bld 10/09/2016 134* 65 - 99 mg/dL Final  . BUN 10/09/2016 10  6 - 20 mg/dL Final  . Creatinine, Ser 10/09/2016 0.77  0.61 - 1.24 mg/dL Final  . Calcium 10/09/2016 8.8* 8.9 - 10.3 mg/dL Final  . GFR calc non Af Amer 10/09/2016 >60  >60 mL/min Final  . GFR calc Af Amer 10/09/2016 >60  >60 mL/min Final   Comment: (NOTE) The eGFR has been calculated using the CKD EPI equation. This calculation has not been validated in all clinical situations. eGFR's persistently <60 mL/min signify possible Chronic Kidney Disease.   Georgiann Hahn gap 10/09/2016 7  5 - 15 Final  Hospital Outpatient Visit on 10/04/2016  Component Date Value Ref Range Status  . MRSA, PCR 10/04/2016 NEGATIVE  NEGATIVE Final  . Staphylococcus aureus 10/04/2016 NEGATIVE   NEGATIVE Final   Comment:        The Xpert SA Assay (FDA approved for NASAL specimens in patients over 68 years of age), is one component of a comprehensive surveillance program.  Test performance has been validated by [facility] for patients greater than or equal to 79 year old. It is not intended to diagnose infection nor to guide or monitor treatment.   . WBC 10/04/2016 7.1  4.0 - 10.5 K/uL Final  . RBC 10/04/2016 4.58  4.22 - 5.81 MIL/uL Final  . Hemoglobin 10/04/2016 14.3  13.0 - 17.0 g/dL Final  . HCT 10/04/2016 42.6  39.0 - 52.0 % Final  . MCV 10/04/2016 93.0  78.0 - 100.0 fL Final  . MCH 10/04/2016 31.2  26.0 - 34.0 pg Final  . MCHC 10/04/2016 33.6  30.0 - 36.0 g/dL Final  . RDW 10/04/2016 13.0  11.5 - 15.5 % Final  . Platelets 10/04/2016 209  150 - 400 K/uL Final  . Sodium 10/04/2016 137  135 - 145 mmol/L Final  . Potassium 10/04/2016 3.9  3.5 - 5.1 mmol/L Final  . Chloride 10/04/2016 102  101 - 111 mmol/L Final  . CO2 10/04/2016 25  22 - 32 mmol/L Final  . Glucose, Bld 10/04/2016 90  65 - 99 mg/dL Final  . BUN 10/04/2016 12  6 - 20 mg/dL Final  . Creatinine, Ser 10/04/2016 0.87  0.61 - 1.24 mg/dL Final  . Calcium 10/04/2016 9.8  8.9 - 10.3 mg/dL Final  . GFR calc non Af Amer 10/04/2016 >60  >60 mL/min Final  . GFR calc Af Amer 10/04/2016 >60  >60 mL/min Final   Comment: (NOTE) The eGFR has been calculated using the CKD EPI equation. This calculation has not been validated in all clinical situations. eGFR's persistently <60 mL/min signify possible Chronic Kidney Disease.   Marland Kitchen  Anion gap 10/04/2016 10  5 - 15 Final  . ABO/RH(D) 10/08/2016 B POS   Final  . Antibody Screen 10/08/2016 NEG   Final  . Sample Expiration 10/08/2016 10/11/2016   Final  . Extend sample reason 10/08/2016 NO TRANSFUSIONS OR PREGNANCY IN THE PAST 3 MONTHS   Final  . ABO/RH(D) 10/04/2016 B POS   Final    Dg Knee 2 Views Right  Result Date: 09/17/2016 CLINICAL DATA:  To recent falls.   Swelling and pain. EXAM: RIGHT KNEE - 1-2 VIEW COMPARISON:  September 08, 2016 FINDINGS: Severe degenerative changes in the lateral and patellofemoral compartments with a small joint effusion. No acute fractures are seen. IMPRESSION: Degenerative changes with small joint effusion. Electronically Signed   By: Dorise Bullion III M.D   On: 09/17/2016 12:05   Dg Hip Port Unilat With Pelvis 1v Left  Result Date: 10/08/2016 CLINICAL DATA:  Left hip replacement EXAM: DG HIP (WITH OR WITHOUT PELVIS) 1V PORT LEFT COMPARISON:  Intraoperative film same day FINDINGS: Two views of the left hip submitted. There is left hip prosthesis with anatomic alignment. Right hip prosthesis with anatomic alignment. IMPRESSION: Left hip prosthesis with anatomic alignment.  She Electronically Signed   By: Lahoma Crocker M.D.   On: 10/08/2016 13:59   Dg Hip Operative Unilat W Or W/o Pelvis Left  Result Date: 10/08/2016 CLINICAL DATA:  Left hip prosthesis EXAM: OPERATIVE left HIP (WITH PELVIS IF PERFORMED) 2 VIEWS TECHNIQUE: Fluoroscopic spot image(s) were submitted for interpretation post-operatively. COMPARISON:  09/08/2016 FINDINGS: Two views of the left hip submitted. There is left hip prosthesis with anatomic alignment. IMPRESSION: Left hip prosthesis with anatomic alignment. Electronically Signed   By: Lahoma Crocker M.D.   On: 10/08/2016 12:36     Assessment/Plan   ICD-9-CM ICD-10-CM   1. Essential hypertension, benign 401.1 I10    BP elevated  2. Status post left hip replacement V43.64 Z96.642   3. History of phlebitis V12.52 Z86.72   4. Dyslipidemia 272.4 E78.5      SW to work with pt regarding placement post d/c. He prefers [employer] current meds as ordered  Check BP every shift. If SBP> 160 will need to adjust BP meds  PT/OT as ordered  F/u with ortho as scheduled  Wound care as ordered  GOAL: short term rehab and d/c home when medically appropriate. Communicated with pt and nursing.  Will  follow  Brezlyn Manrique S. Perlie Gold  [facility] [address] [place], [place] [address] [phone] Cell (Monday-Friday 8 AM - 5 PM) [phone] After 5 PM and follow prompts

## 2016-10-15 NOTE — Telephone Encounter (Signed)
Nurse aware that if she thinks she needs to remove bandage just put dry dressing on top

## 2016-10-25 ENCOUNTER — Ambulatory Visit (INDEPENDENT_AMBULATORY_CARE_PROVIDER_SITE_OTHER): Payer: Medicaid Other | Admitting: Orthopaedic Surgery

## 2016-10-25 DIAGNOSIS — Z96642 Presence of left artificial hip joint: Secondary | ICD-10-CM

## 2016-10-25 NOTE — Progress Notes (Signed)
The patient is doing well just over 2 weeks status post a left total hip replacement. His incision looks great. Remove the staples and placed Steri-Strips. He had a mild seroma that drained and got about 40 mL of fluid off of. He tolerated range of motion as well.  At this point he'll work on increasing his activities. He'll also work on balance and coordination. We'll see him back in a month to see how is doing overall but no x-rays are needed.

## 2016-11-04 MED FILL — oxyCODONE HCL 5 MG TABS: 5 | 2 days supply | Qty: 30 | Fill #0

## 2016-11-17 ENCOUNTER — Other Ambulatory Visit (INDEPENDENT_AMBULATORY_CARE_PROVIDER_SITE_OTHER): Payer: Self-pay | Admitting: Orthopaedic Surgery

## 2016-11-17 ENCOUNTER — Telehealth (INDEPENDENT_AMBULATORY_CARE_PROVIDER_SITE_OTHER): Payer: Self-pay

## 2016-11-17 MED ORDER — OXYCODONE-ACETAMINOPHEN 5-325 MG PO TABS: 1.0000 | ORAL_TABLET | Freq: Three times a day (TID) | ORAL | 0 refills | Status: DC | PRN

## 2016-11-17 NOTE — Telephone Encounter (Signed)
Please advise 

## 2016-11-17 NOTE — Telephone Encounter (Signed)
Patient called and states he needs a refill on Oxycodone. Please call patient to advise.

## 2016-11-17 NOTE — Telephone Encounter (Signed)
Can come and pick up script 

## 2016-11-18 NOTE — Telephone Encounter (Signed)
Patient aware Rx ready at the front desk  

## 2016-11-19 MED FILL — OXYCODONE W/APAP 5/325 TAB: 5-325 | 10 days supply | Qty: 60 | Fill #0

## 2016-11-25 MED FILL — CYCLOBENZAPRINE 5 MG TABLET: 5 | 30 days supply | Qty: 90 | Fill #0 | Status: TO

## 2016-11-25 MED FILL — ENALAPRIL MALEATE 10 MG TAB: 10 | 30 days supply | Qty: 30 | Fill #0

## 2016-11-25 MED FILL — LOVASTATIN 20 MG TABLET: 20 | 30 days supply | Qty: 30 | Fill #0

## 2016-11-25 MED FILL — HYDROCHLOROTHIAZIDE 25 MG T: 25 | 30 days supply | Qty: 30 | Fill #0

## 2016-12-08 ENCOUNTER — Ambulatory Visit (INDEPENDENT_AMBULATORY_CARE_PROVIDER_SITE_OTHER): Payer: Medicaid Other | Admitting: Orthopaedic Surgery

## 2016-12-08 ENCOUNTER — Encounter (INDEPENDENT_AMBULATORY_CARE_PROVIDER_SITE_OTHER): Payer: Self-pay

## 2016-12-08 DIAGNOSIS — Z96642 Presence of left artificial hip joint: Secondary | ICD-10-CM

## 2016-12-08 MED FILL — CLOPIDOGREL 75 MG TABLET: 75 | 30 days supply | Qty: 30 | Fill #0

## 2016-12-08 MED FILL — OXYCODONE-APAP 10-325: 10-325 | 7 days supply | Qty: 21 | Fill #0

## 2016-12-08 NOTE — Progress Notes (Signed)
The patient is now almost 2 months status post a left total hip arthroplasty. [facility]. He is in chronic pain. He is wishing for Norco narcotics today. He also has severe right knee pain and wishes to have this replaced at some point.  On examination patellar be easily put his hip through range of motion and there is no, getting features.  At this point I did refill his Percocet but counseled him extensively about this same that this is not a medication I'll keep him on any more after this. I'll wean the next time to hydrocodone and wanted him off of pain medications from me after 3 months postoperative follow-up.
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
## 2016-12-29 ENCOUNTER — Ambulatory Visit (INDEPENDENT_AMBULATORY_CARE_PROVIDER_SITE_OTHER): Payer: Medicaid Other | Admitting: Orthopaedic Surgery

## 2016-12-29 DIAGNOSIS — Z96642 Presence of left artificial hip joint: Secondary | ICD-10-CM

## 2016-12-29 DIAGNOSIS — M65331 Trigger finger, right middle finger: Secondary | ICD-10-CM | POA: Diagnosis not present

## 2016-12-29 MED ORDER — LIDOCAINE HCL 1 % IJ SOLN
1.0000 mL | INTRAMUSCULAR | Status: AC | PRN
  Administered 2016-12-29: 1 mL

## 2016-12-29 MED ORDER — METHYLPREDNISOLONE ACETATE 40 MG/ML IJ SUSP
40.0000 mg | INTRAMUSCULAR | Status: AC | PRN
  Administered 2016-12-29: 40 mg

## 2016-12-29 MED FILL — OXYCODONE W/APAP 5/325 TAB: 5-325 | 7 days supply | Qty: 14 | Fill #0
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
## 2017-01-07 ENCOUNTER — Ambulatory Visit: Payer: Medicaid Other | Admitting: Physical Therapy

## 2017-01-07 ENCOUNTER — Ambulatory Visit: Payer: Medicaid Other | Attending: Orthopaedic Surgery | Admitting: Rehabilitation

## 2017-01-07 ENCOUNTER — Encounter: Payer: Self-pay | Admitting: Rehabilitation

## 2017-01-07 DIAGNOSIS — G8929 Other chronic pain: Secondary | ICD-10-CM | POA: Diagnosis present

## 2017-01-07 DIAGNOSIS — M25561 Pain in right knee: Secondary | ICD-10-CM | POA: Diagnosis present

## 2017-01-07 DIAGNOSIS — R2689 Other abnormalities of gait and mobility: Secondary | ICD-10-CM

## 2017-01-07 DIAGNOSIS — M6281 Muscle weakness (generalized): Secondary | ICD-10-CM
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
## 2017-01-18 NOTE — Congregational Nurse Program (Signed)
Congregational Nurse Program Note  Date of Encounter: 01/18/2017  Past Medical History: Past Medical History:  Diagnosis Date  . Arthritis   . Hypertension   . Phlebitis    hx of in right leg - 2016     Encounter Details:     CNP Questionnaire - 01/18/17 1919      Patient Demographics   Is this a new or existing patient? Existing   Patient is considered a/an Not Applicable   Race African-American/Black     Patient Assistance   Location of Patient Assistance Not Applicable   Patient's financial/insurance status Medicaid   Uninsured Patient ([place] Card/Care Connects) No   Patient referred to apply for the following financial assistance Not Applicable   Food insecurities addressed Not Applicable   Transportation assistance No   Assistance securing medications Yes   Type of Holiday representativeAssistance [facility]   Educational health offerings Medications;Navigating the healthcare system     Encounter Details   Primary purpose of visit Education/Health Concerns;Navigating the Healthcare System;Spiritual Care/Support Visit   Was an Emergency Department visit averted? Not Applicable   Does patient have a medical provider? Yes   Patient referred to Follow up with established PCP   Was a mental health screening completed? (GAINS tool) No   Does patient have dental issues? No   Does patient have vision issues? No   Does your patient have an abnormal blood pressure today? No   Since previous encounter, have you referred patient for abnormal blood pressure that resulted in a new diagnosis or medication change? No   Does your patient have an abnormal blood glucose today? No   Since previous encounter, have you referred patient for abnormal blood glucose that resulted in a new diagnosis or medication change? No   Was there a life-saving intervention made? No    Client in today upset that he cant afford his medications. States his mother has been helping but is now  Sick and being discharged  from hospital for  Kidney  Failure and is on dialysis. His mother will be living with a sister who will be controlling her finances and he has no other support , no money  Until he gets his disability . He has an  AstronomerAppointment with  The  Disability  MD and hopes he will soon get disability. States he has never been in this  Situation and just needs help. Medications are at [facility] [address] ,nurse will help  With  Co pays  For his medication for this month. Referral to  [facility] to  Transfer scripts  And  Alert nurse to cost  Of  Medications that will need to  Be filled  . Referral sent. Will let  Client know  Tomorrow  If we can help. Client is working with  Consulting civil engineertudent SW on his social issues . Offered  Support  And  Listened. Follow as needed.

## 2017-01-19 ENCOUNTER — Ambulatory Visit: Payer: Medicaid Other | Attending: Orthopaedic Surgery | Admitting: Physical Therapy

## 2017-01-19 ENCOUNTER — Encounter: Payer: Self-pay | Admitting: Physical Therapy

## 2017-01-19 DIAGNOSIS — R2689 Other abnormalities of gait and mobility: Secondary | ICD-10-CM | POA: Diagnosis present

## 2017-01-19 DIAGNOSIS — M6281 Muscle weakness (generalized): Secondary | ICD-10-CM

## 2017-01-19 DIAGNOSIS — G8929 Other chronic pain: Secondary | ICD-10-CM | POA: Insufficient documentation

## 2017-01-19 DIAGNOSIS — M25561 Pain in right knee: Secondary | ICD-10-CM | POA: Insufficient documentation

## 2017-01-19 MED FILL — ENALAPRIL MALEATE 10 MG TAB: 10 | 30 days supply | Qty: 30 | Fill #1 | Status: TO

## 2017-01-19 MED FILL — HYDROCHLOROTHIAZIDE 25 MG T: 25 | 30 days supply | Qty: 30 | Fill #1 | Status: TO

## 2017-01-19 MED FILL — LOVASTATIN 20 MG TABLET: 20 | 30 days supply | Qty: 30 | Fill #1 | Status: TO

## 2017-01-19 NOTE — Therapy (Addendum)
[facility] [address] [place], [place], [address] Phone: [phone]   Fax:  [phone]  Physical Therapy Treatment  Patient Details  Name: Kyle Fleming MRN: [id number] Date of Birth: October 24, 1956 Referring Provider: Doneen Poissonhristopher Blackman, MD  Encounter Date: 01/19/2017      PT End of Session - 01/19/17 1456    Visit Number 2   Number of Visits 8   Date for PT Re-Evaluation 04/07/17   Authorization Type MCD (awiting approval)   PT Start Time 1015   PT Stop Time 1057   PT Time Calculation (min) 42 min   Activity Tolerance Patient tolerated treatment well   Behavior During Therapy [facility] for tasks assessed/performed      Past Medical History:  Diagnosis Date  . Arthritis   . Hypertension   . Phlebitis    hx of in right leg - 2016     Past Surgical History:  Procedure Laterality Date  . JOINT REPLACEMENT  2003   right hip   . st[facility] in right leg      [facility]   . TOTAL HIP ARTHROPLASTY Left 10/08/2016   Procedure: LEFT TOTAL HIP ARTHROPLASTY ANTERIOR APPROACH;  Surgeon: Kathryne Hitchhristopher Y Blackman, MD;  Location: [facility];  Service: Orthopedics;  Laterality: Left;    There were no vitals filed for this visit.      Subjective Assessment - 01/19/17 1450    Subjective Patient reports he has been working on the exercises. He continues to have pain in his knee with actvitiy and pain in his hip. The exercises do not increase his pain.    Limitations House hold activities;Walking;Standing   Patient Stated Goals "I want to get rid of this w/c"    Currently in Pain? Yes   Pain Score 8    Pain Location Knee   Pain Orientation Right   Pain Descriptors / Indicators Aching   Pain Type Chronic pain   Pain Onset More than a month ago   Pain Frequency Constant   Aggravating Factors  movement, standing, walking    Pain Relieving Factors rest   Multiple Pain Sites Yes   Pain Score 7   Pain Location Hip   Pain  Orientation Left   Pain Descriptors / Indicators Aching   Pain Type Chronic pain   Pain Onset More than a month ago   Pain Frequency Constant   Aggravating Factors  standing, walking    Pain Relieving Factors rest    Effect of Pain on Daily Activities unable to walk                                  PT Education - 01/19/17 1455    Education provided Yes   Education Details significant education provided on the improtance of symptom mangement    Person(s) Educated Patient   Methods Explanation;Demonstration   Comprehension Verbalized understanding;Returned demonstration;Need further instruction          PT Short Term Goals - 01/19/17 1545      PT SHORT TERM GOAL #1   Title Pt will initiate HEP in order to indicate improved functional mobility and reduced pain.  (Target Date: By second visit following eval)   Baseline reviewed HEP    Time 4   Period Weeks   Status On-going     PT SHORT TERM GOAL #2   Title Pt will report initiation  of seeking orthopedic MD advise for R knee pain management.     Baseline PT recommended getting second opinion regarding management of knee pain   Time 4   Period Weeks   Status New     PT SHORT TERM GOAL #3   Title Pt will ambulate up to 250' w/ LRAD in order to indicate safety with gait and improved gait pattern.    Baseline up to 30' w/o device at min A level for safety.    Time 4   Period Weeks   Status New           PT Long Term Goals - 01/07/17 2044      PT LONG TERM GOAL #1   Title Pt will be independent with final HEP in order to indicate improved functional mobility and decreased pain.  (Target Date:  Following sixth visit after eval)   Baseline dependent      PT LONG TERM GOAL #2   Title Pt will report no more than 7/10 pain with functional mobility in order to indicate pain is reduced limiting factor.     Baseline 9/10 pain at all times     PT LONG TERM GOAL #3   Title Pt will improve knee  extension to -15 deg in order to indicate improved ROM and functional in R knee.    Baseline -35 deg knee ext     PT LONG TERM GOAL #4   Title Pt will improve R knee flexion to 115 deg (AROM) with decreased reports of pain in order to indicate improved functional in R knee.    Baseline 106 deg knee flex     PT LONG TERM GOAL #5   Title Pt will ambulate up to 500' w/ LRAD at mod I level over indoor and paved outdoor surfaces in order to indicate safe return to household and community ambulation.    Baseline 30' w/o AD at min A level                Plan - 01/19/17 1457    Clinical Impression Statement Patient was given light exrcises for his knees and hip. She ewas also given some seated exercises for his core and shoulder. He reported no significant increase in pain. Therapy also talked to him about standing posture and had him stand and extend his knee. With cuing he was able to correct his posture. He reports his shoulders get tired when he is using the walker. He was advised to stand straighter and he will have to put less weight on his shoulders. He was advised his muslces may be sore but to stick to the light exercises if his actual hip joint or knee joint is sore.    Rehab Potential Fair   Clinical Impairments Affecting Rehab Potential severity of R knee pain and deformity   PT Frequency 1x/ week    PT Duration 8 weeks    PT Treatment/Interventions ADLs/Self Care Home Management;Cryotherapy;Electrical Stimulation;Moist Heat;Ultrasound;DME Instruction;Gait training;Stair training;Functional mobility training;Therapeutic activities;Therapeutic exercise;Balance training;Patient/family education;Manual techniques;Passive range of motion;Taping   PT Next Visit Plan establish HEP for R knee, any modalities or mobs that could help with pain, further assess for ligamentous or meniscus issues, gait with RW to better assist with antalgic gait pattern, Please feel free to adjust goals-I was so  out of my league with this evaluation!   PT Home Exercise Plan quad set, glut set, seated hip abduction, seated hip aduction, scpaular retraction, shoulder extension,  Consulted and Agree with Plan of Care Patient      Patient will benefit from skilled therapeutic intervention in order to improve the following deficits and impairments:  Decreased activity tolerance, Decreased balance, Decreased endurance, Decreased knowledge of use of DME, Decreased mobility, Decreased range of motion, Difficulty walking, Impaired perceived functional ability, Impaired flexibility, Improper body mechanics, Pain  Visit Diagnosis: Muscle weakness (generalized)  Chronic pain of right knee  Other abnormalities of gait and mobility     Problem List Patient Active Problem List   Diagnosis Date Noted  . Dyslipidemia 10/12/2016  . Essential hypertension, benign 10/12/2016  . History of phlebitis 10/12/2016  . Unilateral primary osteoarthritis, left hip 10/08/2016  . Status post left hip replacement 10/08/2016    Dessie Coma PT DPT  01/19/2017, 3:50 PM  [facility] [address] [place], [place], [address] Phone: [phone]   Fax:  [phone]  Name: Kyle Fleming MRN: [id number] Date of Birth: [birth year]

## 2017-01-19 NOTE — Congregational Nurse Program (Signed)
Congregational Nurse Program Note  Date of Encounter: 01/19/2017  Past Medical History: Past Medical History:  Diagnosis Date  . Arthritis   . Hypertension   . Phlebitis    hx of in right leg - 2016     Encounter Details:     CNP Questionnaire - 01/19/17 2341      Patient Demographics   Is this a new or existing patient? Existing   Patient is considered a/an Not Applicable   Race African-American/Black     Patient Assistance   Location of Patient Assistance Not Applicable   Patient's financial/insurance status Medicaid   Uninsured Patient ([place] Card/Care Connects) No   Patient referred to apply for the following financial assistance Medicaid   Food insecurities addressed Not Applicable   Transportation assistance No   Assistance securing medications Yes   Type of Assistance [facility]   Educational health offerings Medications     Encounter Details   Primary purpose of visit Navigating the Healthcare System   Was an Emergency Department visit averted? Not Applicable   Does patient have a medical provider? Yes   Patient referred to Follow up with established PCP   Was a mental health screening completed? (GAINS tool) No   Does patient have dental issues? No   Does patient have vision issues? No   Does your patient have an abnormal blood pressure today? No   Since previous encounter, have you referred patient for abnormal blood pressure that resulted in a new diagnosis or medication change? No   Does your patient have an abnormal blood glucose today? No   Since previous encounter, have you referred patient for abnormal blood glucose that resulted in a new diagnosis or medication change? No   Was there a life-saving intervention made? No      Nurse navigating system to get clients  Medication filled . Several call to pharmacy and to client to  Identify where client got his last script filled. [facility] . Nurse called in for  Refill,and picked up clients  medications. Client not in during visit today [facility] Will follow up next week and do vitals. Client had not had a refill on his  medications since January.

## 2017-01-26 ENCOUNTER — Ambulatory Visit (INDEPENDENT_AMBULATORY_CARE_PROVIDER_SITE_OTHER): Payer: Medicaid Other | Admitting: Orthopaedic Surgery

## 2017-01-26 ENCOUNTER — Ambulatory Visit (INDEPENDENT_AMBULATORY_CARE_PROVIDER_SITE_OTHER): Payer: Medicaid Other

## 2017-01-26 ENCOUNTER — Ambulatory Visit (INDEPENDENT_AMBULATORY_CARE_PROVIDER_SITE_OTHER): Payer: Self-pay

## 2017-01-26 DIAGNOSIS — M25512 Pain in left shoulder: Secondary | ICD-10-CM

## 2017-01-26 DIAGNOSIS — M545 Low back pain: Secondary | ICD-10-CM

## 2017-01-26 DIAGNOSIS — M25561 Pain in right knee: Secondary | ICD-10-CM | POA: Diagnosis not present

## 2017-01-26 DIAGNOSIS — M25532 Pain in left wrist: Secondary | ICD-10-CM | POA: Diagnosis not present

## 2017-01-26 DIAGNOSIS — M25511 Pain in right shoulder: Secondary | ICD-10-CM | POA: Diagnosis not present

## 2017-01-26 DIAGNOSIS — G8929 Other chronic pain: Secondary | ICD-10-CM | POA: Diagnosis not present

## 2017-01-26 DIAGNOSIS — M25531 Pain in right wrist: Secondary | ICD-10-CM
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
## 2017-02-02 ENCOUNTER — Ambulatory Visit: Payer: Medicaid Other | Admitting: Physical Therapy

## 2017-02-02 ENCOUNTER — Encounter: Payer: Self-pay | Admitting: Physical Therapy

## 2017-02-02 DIAGNOSIS — M25561 Pain in right knee: Secondary | ICD-10-CM

## 2017-02-02 DIAGNOSIS — R2689 Other abnormalities of gait and mobility: Secondary | ICD-10-CM

## 2017-02-02 DIAGNOSIS — G8929 Other chronic pain: Secondary | ICD-10-CM

## 2017-02-02 DIAGNOSIS — M6281 Muscle weakness (generalized): Secondary | ICD-10-CM | POA: Diagnosis not present

## 2017-02-02 NOTE — Therapy (Addendum)
[facility] [address] [place], [place], [address] Phone: [phone]   Fax:  [phone]  Physical Therapy Treatment  Patient Details  Name: Kyle Fleming MRN: [id number] Date of Birth: 1956/02/06 Referring Provider: Doneen Poisson, MD  Encounter Date: 02/02/2017      PT End of Session - 02/02/17 1540    Visit Number 3   Number of Visits 8   Date for PT Re-Evaluation 04/07/17   Authorization Type MCD (awiting approval)   PT Start Time 1043   PT Stop Time 1130   PT Time Calculation (min) 47 min     1x a week for 8 weeks  Past Medical History:  Diagnosis Date  . Arthritis   . Hypertension   . Phlebitis    hx of in right leg - 2016     Past Surgical History:  Procedure Laterality Date  . JOINT REPLACEMENT  2003   right hip   . stents placed in right leg      AT [facility]- 2016   . TOTAL HIP ARTHROPLASTY Left 10/08/2016   Procedure: LEFT TOTAL HIP ARTHROPLASTY ANTERIOR APPROACH;  Surgeon: Kathryne Hitch, MD;  Location: [facility];  Service: Orthopedics;  Laterality: Left;    There were no vitals filed for this visit.      Subjective Assessment - 02/02/17 1531    Subjective Patient continues to work on his exercises. He reports he feels like he can walk a little better. He does continue to have consitently high pain in his knee and his back.    Limitations House hold activities;Walking;Standing   Patient Stated Goals "I want to get rid of this w/c"    Currently in Pain? Yes   Pain Score 7    Pain Location Knee   Pain Orientation Right   Pain Descriptors / Indicators Aching   Pain Type Chronic pain   Pain Onset More than a month ago   Pain Frequency Constant   Aggravating Factors  mvement, standin, walking    Pain Relieving Factors rest   Multiple Pain Sites Yes   Pain Score 7   Pain Location Back   Pain Orientation Left   Pain Descriptors / Indicators Aching   Pain Onset More than a month ago    Pain Frequency Constant   Aggravating Factors  standing, walking    Pain Relieving Factors rest    Effect of Pain on Daily Activities uanble to walk                          [facility] Adult PT Treatment/Exercise - 02/02/17 0001      Lumbar Exercises: Stretches   Passive Hamstring Stretch Limitations seated hamstring stretch 2x2osec holds with cuing to not push right knee into a painful position   Single Knee to Chest Stretch Limitations 2x20sec with towel    Piriformis Stretch Limitations 2x20sec hold with towel      Knee/Hip Exercises: Stretches   Passive Hamstring Stretch Limitations 2x20sec hold      Knee/Hip Exercises: Seated   Clamshell with TheraBand Yellow     Knee/Hip Exercises: Supine   Quad Sets Limitations with glut sets 2x10    Short Arc Quad Sets Limitations 2x10 with left    Heel Slides Limitations 2x5 bilateral    Other Supine Knee/Hip Exercises supine march with abdominal brace 2x10      Shoulder Exercises: Seated   Other Seated Exercises Bilateral ER  2x10; Seated row yellow 2x10; Seated extension 2x10 yellow                   PT Short Term Goals - 02/02/17 1538      PT SHORT TERM GOAL #1   Title Pt will initiate HEP in order to indicate improved functional mobility and reduced pain.  (Target Date: By second visit following eval)   Baseline reviewed HEP    Time 4   Period Weeks   Status On-going     PT SHORT TERM GOAL #2   Title Pt will report initiation of seeking orthopedic MD advise for R knee pain management.     Baseline PT recommended getting second opinion regarding management of knee pain   Time 4   Period Weeks   Status On-going     PT SHORT TERM GOAL #3   Title Pt will ambulate up to 250' w/ LRAD in order to indicate safety with gait and improved gait pattern.    Baseline up to 30' w/o device at min A level for safety.    Time 4   Period Weeks   Status On-going           PT Long Term Goals - 01/07/17 2044       PT LONG TERM GOAL #1   Title Pt will be independent with final HEP in order to indicate improved functional mobility and decreased pain.  (Target Date:  Following sixth visit after eval)   Baseline dependent      PT LONG TERM GOAL #2   Title Pt will report no more than 7/10 pain with functional mobility in order to indicate pain is reduced limiting factor.     Baseline 9/10 pain at all times     PT LONG TERM GOAL #3   Title Pt will improve knee extension to -15 deg in order to indicate improved ROM and functional in R knee.    Baseline -35 deg knee ext     PT LONG TERM GOAL #4   Title Pt will improve R knee flexion to 115 deg (AROM) with decreased reports of pain in order to indicate improved functional in R knee.    Baseline 106 deg knee flex     PT LONG TERM GOAL #5   Title Pt will ambulate up to 500' w/ LRAD at mod I level over indoor and paved outdoor surfaces in order to indicate safe return to household and community ambulation.    Baseline 30' w/o AD at min A level                Plan - 02/02/17 1535    Clinical Impression Statement Patient tolerated treatment well. Therapy updated HEP. He was advised to pick 3-4 exercises and use his stretches when able. He had no increase in pain but continued to have knee pain.    Rehab Potential Fair   PT Treatment/Interventions ADLs/Self Care Home Management;Cryotherapy;Electrical Stimulation;Moist Heat;Ultrasound;DME Instruction;Gait training;Stair training;Functional mobility training;Therapeutic activities;Therapeutic exercise;Balance training;Patient/family education;Manual techniques;Passive range of motion;Taping   PT Next Visit Plan assess tolerance to the new HEP. consder adding standing exercises. adjust HEP as tolerated.    PT Home Exercise Plan quad set, glut set, seated hip abduction, seated hip aduction, scpaular retraction, shoulder extension,    Consulted and Agree with Plan of Care Patient      Patient will  benefit from skilled therapeutic intervention in order to improve the following deficits and impairments:  Decreased  activity tolerance, Decreased balance, Decreased endurance, Decreased knowledge of use of DME, Decreased mobility, Decreased range of motion, Difficulty walking, Impaired perceived functional ability, Impaired flexibility, Improper body mechanics, Pain  Visit Diagnosis: Muscle weakness (generalized)  Chronic pain of right knee  Other abnormalities of gait and mobility     Problem List Patient Active Problem List   Diagnosis Date Noted  . Dyslipidemia 10/12/2016  . Essential hypertension, benign 10/12/2016  . History of phlebitis 10/12/2016  . Unilateral primary osteoarthritis, left hip 10/08/2016  . Status post left hip replacement 10/08/2016    Dessie Coma PT DPT  02/02/2017, 3:41 PM  [facility] [address] [place], [place], [address] Phone: [phone]   Fax:  [phone]  Name: Kyle Fleming MRN: [id number] Date of Birth: 06/20/1956

## 2017-02-09 ENCOUNTER — Encounter: Payer: Self-pay | Admitting: Physical Therapy

## 2017-02-09 ENCOUNTER — Ambulatory Visit: Payer: Medicaid Other | Admitting: Physical Therapy

## 2017-02-09 NOTE — Therapy (Signed)
[facility] [address] [place], [place], [address] Phone: [phone]   Fax:  [phone]  Physical Therapy Treatment  Patient Details  Name: MAICO MULVEHILL MRN: [id number] Date of Birth: 03-May-1956 Referring Provider: Doneen Poisson, MD  Encounter Date: 02/09/2017      PT End of Session - 02/09/17 1007    Visit Number 3   Number of Visits 8   Date for PT Re-Evaluation 04/07/17   PT Start Time --   Activity Tolerance Patient tolerated treatment well   Behavior During Therapy [facility] for tasks assessed/performed      Past Medical History:  Diagnosis Date  . Arthritis   . Hypertension   . Phlebitis    hx of in right leg - 2016     Past Surgical History:  Procedure Laterality Date  . JOINT REPLACEMENT  2003   right hip   . stents placed in right leg      AT [facility]- 2016   . TOTAL HIP ARTHROPLASTY Left 10/08/2016   Procedure: LEFT TOTAL HIP ARTHROPLASTY ANTERIOR APPROACH;  Surgeon: Kathryne Hitch, MD;  Location: [facility];  Service: Orthopedics;  Laterality: Left;    There were no vitals filed for this visit.                                 PT Short Term Goals - 02/02/17 1538      PT SHORT TERM GOAL #1   Title Pt will initiate HEP in order to indicate improved functional mobility and reduced pain.  (Target Date: By second visit following eval)   Baseline reviewed HEP    Time 4   Period Weeks   Status On-going     PT SHORT TERM GOAL #2   Title Pt will report initiation of seeking orthopedic MD advise for R knee pain management.     Baseline PT recommended getting second opinion regarding management of knee pain   Time 4   Period Weeks   Status On-going     PT SHORT TERM GOAL #3   Title Pt will ambulate up to 250' w/ LRAD in order to indicate safety with gait and improved gait pattern.    Baseline up to 30' w/o device at min A level for safety.    Time 4   Period Weeks    Status On-going           PT Long Term Goals - 01/07/17 2044      PT LONG TERM GOAL #1   Title Pt will be independent with final HEP in order to indicate improved functional mobility and decreased pain.  (Target Date:  Following sixth visit after eval)   Baseline dependent      PT LONG TERM GOAL #2   Title Pt will report no more than 7/10 pain with functional mobility in order to indicate pain is reduced limiting factor.     Baseline 9/10 pain at all times     PT LONG TERM GOAL #3   Title Pt will improve knee extension to -15 deg in order to indicate improved ROM and functional in R knee.    Baseline -35 deg knee ext     PT LONG TERM GOAL #4   Title Pt will improve R knee flexion to 115 deg (AROM) with decreased reports of pain in order to indicate improved functional in R knee.  Baseline 106 deg knee flex     PT LONG TERM GOAL #5   Title Pt will ambulate up to 500' w/ LRAD at mod I level over indoor and paved outdoor surfaces in order to indicate safe return to household and community ambulation.    Baseline 30' w/o AD at min A level                Plan - 02/09/17 1018    Clinical Impression Statement pt reported he had an appointment and was only able to stay for 10 minutes. due to time limitations opted to reschedule todays visit, which he is returning on 4/4.       Patient will benefit from skilled therapeutic intervention in order to improve the following deficits and impairments:     Visit Diagnosis: Muscle weakness (generalized)  Chronic pain of right knee  Other abnormalities of gait and mobility     Problem List Patient Active Problem List   Diagnosis Date Noted  . Dyslipidemia 10/12/2016  . Essential hypertension, benign 10/12/2016  . History of phlebitis 10/12/2016  . Unilateral primary osteoarthritis, left hip 10/08/2016  . Status post left hip replacement 10/08/2016   Lulu RidingKristoffer Haruka Kowaleski PT, DPT, LAT, ATC  02/09/17  10:20  AM      [facility] [address] [place], [place], [id number] Phone: [phone]   Fax:  [phone]  Name: Delorise ShinerBroaderick D Philbrook MRN: [id number] Date of Birth: 01-19-1956

## 2017-02-16 ENCOUNTER — Ambulatory Visit: Payer: Medicaid Other | Attending: Orthopaedic Surgery | Admitting: Physical Therapy

## 2017-02-16 ENCOUNTER — Encounter: Payer: Self-pay | Admitting: Physical Therapy

## 2017-02-16 DIAGNOSIS — G8929 Other chronic pain: Secondary | ICD-10-CM | POA: Diagnosis present

## 2017-02-16 DIAGNOSIS — R2689 Other abnormalities of gait and mobility: Secondary | ICD-10-CM | POA: Insufficient documentation

## 2017-02-16 DIAGNOSIS — M25561 Pain in right knee: Secondary | ICD-10-CM | POA: Insufficient documentation

## 2017-02-16 DIAGNOSIS — M6281 Muscle weakness (generalized): Secondary | ICD-10-CM | POA: Insufficient documentation

## 2017-02-16 NOTE — Therapy (Signed)
[facility] [address] [place], [place], [address] Phone: [phone]   Fax:  [phone]  Physical Therapy Treatment  Patient Details  Name: Kyle Fleming MRN: [id number] Date of Birth: [birth year] Referring Provider: Doneen Poisson, MD  Encounter Date: 02/16/2017      PT End of Session - 02/16/17 1548    Visit Number 4   Number of Visits 8   Date for PT Re-Evaluation 04/07/17   Authorization Type MCD (awiting approval)   PT Start Time 1015   PT Stop Time 1057   PT Time Calculation (min) 42 min   Activity Tolerance Patient tolerated treatment well   Behavior During Therapy [facility] for tasks assessed/performed      Past Medical History:  Diagnosis Date  . Arthritis   . Hypertension   . Phlebitis    hx of in right leg - 2016     Past Surgical History:  Procedure Laterality Date  . JOINT REPLACEMENT  2003   right hip   . stents placed in right leg      AT [facility]- 2016   . TOTAL HIP ARTHROPLASTY Left 10/08/2016   Procedure: LEFT TOTAL HIP ARTHROPLASTY ANTERIOR APPROACH;  Surgeon: Kathryne Hitch, MD;  Location: [facility];  Service: Orthopedics;  Laterality: Left;    There were no vitals filed for this visit.      Subjective Assessment - 02/16/17 1048    Subjective Patient continues to report improvements in pain and ability to ambulate., He has been working on his exercises at home.    Limitations House hold activities;Walking;Standing   Patient Stated Goals "I want to get rid of this w/c"    Currently in Pain? Yes   Pain Score 5    Pain Location Knee   Pain Orientation Right   Pain Type Chronic pain   Pain Onset More than a month ago   Pain Frequency Constant   Aggravating Factors  movement, standing, walking    Pain Relieving Factors rest    Multiple Pain Sites No   Pain Score 7   Pain Location Back   Pain Orientation Left   Pain Descriptors / Indicators Aching   Pain Onset More than a month  ago   Pain Frequency Constant   Aggravating Factors  standing and walking    Pain Relieving Factors rest    Effect of Pain on Daily Activities difficulty walking                          [facility] Adult PT Treatment/Exercise - 02/16/17 0001      Lumbar Exercises: Stretches   Passive Hamstring Stretch Limitations seated hamstring stretch 2x2osec holds with cuing to not push right knee into a painful position   Single Knee to Chest Stretch Limitations 2x20sec with towel    Piriformis Stretch Limitations 2x20sec hold with towel      Knee/Hip Exercises: Stretches   Passive Hamstring Stretch Limitations 2x20sec hold      Knee/Hip Exercises: Seated   Ball Squeeze --  3x10      Knee/Hip Exercises: Supine   Quad Sets Limitations with glut set 3x10    Short Arc Quad Sets Limitations 2x10    Heel Slides Limitations 2x5   Bridges Limitations 2x10    Straight Leg Raises Limitations 2x5    Other Supine Knee/Hip Exercises supine march with abdominal brace 2x10      Shoulder Exercises: Seated  Other Seated Exercises Bilateral ER 2x10; Seated row yellow 2x10; Seated extension 2x10 yellow                 PT Education - 02/16/17 1547    Education provided Yes   Education Details Continue with exercises and walking    Person(s) Educated Patient   Methods Explanation;Demonstration   Comprehension Verbalized understanding;Returned demonstration;Need further instruction          PT Short Term Goals - 02/02/17 1538      PT SHORT TERM GOAL #1   Title Pt will initiate HEP in order to indicate improved functional mobility and reduced pain.  (Target Date: By second visit following eval)   Baseline reviewed HEP    Time 4   Period Weeks   Status On-going     PT SHORT TERM GOAL #2   Title Pt will report initiation of seeking orthopedic MD advise for R knee pain management.     Baseline PT recommended getting second opinion regarding management of knee pain   Time 4    Period Weeks   Status On-going     PT SHORT TERM GOAL #3   Title Pt will ambulate up to 250' w/ LRAD in order to indicate safety with gait and improved gait pattern.    Baseline up to 30' w/o device at min A level for safety.    Time 4   Period Weeks   Status On-going           PT Long Term Goals - 01/07/17 2044      PT LONG TERM GOAL #1   Title Pt will be independent with final HEP in order to indicate improved functional mobility and decreased pain.  (Target Date:  Following sixth visit after eval)   Baseline dependent      PT LONG TERM GOAL #2   Title Pt will report no more than 7/10 pain with functional mobility in order to indicate pain is reduced limiting factor.     Baseline 9/10 pain at all times     PT LONG TERM GOAL #3   Title Pt will improve knee extension to -15 deg in order to indicate improved ROM and functional in R knee.    Baseline -35 deg knee ext     PT LONG TERM GOAL #4   Title Pt will improve R knee flexion to 115 deg (AROM) with decreased reports of pain in order to indicate improved functional in R knee.    Baseline 106 deg knee flex     PT LONG TERM GOAL #5   Title Pt will ambulate up to 500' w/ LRAD at mod I level over indoor and paved outdoor surfaces in order to indicate safe return to household and community ambulation.    Baseline 30' w/o AD at min A level                Plan - 02/16/17 1549    Clinical Impression Statement Patient tolerated treatment well. He had no increase in pain. Patient added bridging, SLR, and LAQ in limited range. He had no increase in pain.    Rehab Potential Fair   Clinical Impairments Affecting Rehab Potential severity of R knee pain and deformity   PT Frequency Biweekly   PT Duration 12 weeks   PT Treatment/Interventions ADLs/Self Care Home Management;Cryotherapy;Electrical Stimulation;Moist Heat;Ultrasound;DME Instruction;Gait training;Stair training;Functional mobility training;Therapeutic  activities;Therapeutic exercise;Balance training;Patient/family education;Manual techniques;Passive range of motion;Taping   PT Next Visit Plan assess  tolerance to the new HEP. consder adding standing exercises. adjust HEP as tolerated.    PT Home Exercise Plan quad set, glut set, seated hip abduction, seated hip aduction, scpaular retraction, shoulder extension,    Consulted and Agree with Plan of Care Patient      Patient will benefit from skilled therapeutic intervention in order to improve the following deficits and impairments:  Decreased activity tolerance, Decreased balance, Decreased endurance, Decreased knowledge of use of DME, Decreased mobility, Decreased range of motion, Difficulty walking, Impaired perceived functional ability, Impaired flexibility, Improper body mechanics, Pain  Visit Diagnosis: Muscle weakness (generalized)  Chronic pain of right knee  Other abnormalities of gait and mobility     Problem List Patient Active Problem List   Diagnosis Date Noted  . Dyslipidemia 10/12/2016  . Essential hypertension, benign 10/12/2016  . History of phlebitis 10/12/2016  . Unilateral primary osteoarthritis, left hip 10/08/2016  . Status post left hip replacement 10/08/2016    Dessie Coma 02/16/2017, 3:54 PM  [facility] [address] [place], [place], [address] Phone: [phone]   Fax:  [phone]  Name: Kyle Fleming MRN: [id number] Date of Birth: 02/14/1956

## 2017-03-02 ENCOUNTER — Encounter: Payer: Self-pay | Admitting: Physical Therapy

## 2017-03-02 ENCOUNTER — Ambulatory Visit: Payer: Medicaid Other | Admitting: Physical Therapy

## 2017-03-02 DIAGNOSIS — R2689 Other abnormalities of gait and mobility: Secondary | ICD-10-CM

## 2017-03-02 DIAGNOSIS — M25561 Pain in right knee: Secondary | ICD-10-CM

## 2017-03-02 DIAGNOSIS — G8929 Other chronic pain: Secondary | ICD-10-CM

## 2017-03-02 DIAGNOSIS — M6281 Muscle weakness (generalized): Secondary | ICD-10-CM

## 2017-03-02 NOTE — Therapy (Addendum)
[place], [place], [address] Phone: [phone]   Fax:  [phone]  Physical Therapy Treatment/ Discharge   Patient Details  Name: Kyle Fleming MRN: [id number] Date of Birth: 08-18-1956 Referring Provider: Jean Rosenthal, MD  Encounter Date: 03/02/2017      PT End of Session - 03/02/17 1022    Visit Number 5   Number of Visits 8   Date for PT Re-Evaluation 04/07/17   Authorization Type MCD (awiting approval)   PT Start Time 1015   PT Stop Time 1057   PT Time Calculation (min) 42 min   Activity Tolerance Patient tolerated treatment well   Behavior During Therapy [facility] for tasks assessed/performed      Past Medical History:  Diagnosis Date  . Arthritis   . Hypertension   . Phlebitis    hx of in right leg - 2016     Past Surgical History:  Procedure Laterality Date  . JOINT REPLACEMENT  2003   right hip   . stents placed in right leg      AT [facility]- 2016   . TOTAL HIP ARTHROPLASTY Left 10/08/2016   Procedure: LEFT TOTAL HIP ARTHROPLASTY ANTERIOR APPROACH;  Surgeon: Mcarthur Rossetti, MD;  Location: [facility];  Service: Orthopedics;  Laterality: Left;    There were no vitals filed for this visit.      Subjective Assessment - 03/02/17 1031    Subjective Patient reports improvements. He feels he is doing better stsanding    Limitations House hold activities;Walking;Standing   Patient Stated Goals "I want to get rid of this w/c"    Currently in Pain? Yes   Pain Score 5    Pain Location Knee   Pain Orientation Right   Pain Descriptors / Indicators Aching   Pain Type Chronic pain   Pain Onset More than a month ago   Pain Frequency Constant   Aggravating Factors  movement, standing, walking    Pain Relieving Factors rest    Multiple Pain Sites No                         [facility] Adult PT Treatment/Exercise - 03/02/17 0001      Lumbar Exercises: Stretches   Passive Hamstring Stretch Limitations seated hamstring stretch 2x2osec holds with cuing to not push right knee into a painful position   Single Knee to Chest Stretch Limitations 2x20sec with towel    Piriformis Stretch Limitations 2x20sec hold with towel      Knee/Hip Exercises: Standing   Other Standing Knee Exercises standing right march 2x10; right hip andcution 2x10; right hip extension 2x10     Knee/Hip Exercises: Supine   Short Arc Quad Sets Limitations 2x10   Heel Slides Limitations 2x5   Bridges Limitations 2x10   Straight Leg Raises Limitations 2x0 bilateral; minor pain on the right    Other Supine Knee/Hip Exercises supine march with abdominal brace 2x10      Shoulder Exercises: Seated   Other Seated Exercises Bilateral ER 2x10 red; Seated row green 2x10; Seated extension 2x10 green                   PT Short Term Goals - 02/02/17 1538      PT SHORT TERM GOAL #1   Title Pt will initiate HEP in order to indicate improved functional mobility and reduced pain.  (Target Date: By second visit following eval)  Baseline reviewed HEP    Time 4   Period Weeks   Status On-going     PT SHORT TERM GOAL #2   Title Pt will report initiation of seeking orthopedic MD advise for R knee pain management.     Baseline PT recommended getting second opinion regarding management of knee pain   Time 4   Period Weeks   Status On-going     PT SHORT TERM GOAL #3   Title Pt will ambulate up to 250' w/ LRAD in order to indicate safety with gait and improved gait pattern.    Baseline up to 30' w/o device at min A level for safety.    Time 4   Period Weeks   Status On-going           PT Long Term Goals - 01/07/17 2044      PT LONG TERM GOAL #1   Title Pt will be independent with final HEP in order to indicate improved functional mobility and decreased pain.  (Target Date:  Following sixth visit after eval)   Baseline dependent      PT LONG TERM GOAL #2   Title Pt will  report no more than 7/10 pain with functional mobility in order to indicate pain is reduced limiting factor.     Baseline 9/10 pain at all times     PT LONG TERM GOAL #3   Title Pt will improve knee extension to -15 deg in order to indicate improved ROM and functional in R knee.    Baseline -35 deg knee ext     PT LONG TERM GOAL #4   Title Pt will improve R knee flexion to 115 deg (AROM) with decreased reports of pain in order to indicate improved functional in R knee.    Baseline 106 deg knee flex     PT LONG TERM GOAL #5   Title Pt will ambulate up to 500' w/ LRAD at mod I level over indoor and paved outdoor surfaces in order to indicate safe return to household and community ambulation.    Baseline 30' w/o AD at min A level                Plan - 03/02/17 1129    Clinical Impression Statement Patient is making great progress. Therapy reviewed standing exercises. Functionaly he is making great progress. he feels like he can stand in the shower better and has even been able to walk using his wheelchair as a walker. His right knee continues to be limiting.    Rehab Potential Good   Clinical Impairments Affecting Rehab Potential severity of R knee pain and deformity   PT Frequency 1x / week   PT Duration 8 weeks   PT Treatment/Interventions ADLs/Self Care Home Management;Cryotherapy;Electrical Stimulation;Moist Heat;Ultrasound;DME Instruction;Gait training;Stair training;Functional mobility training;Therapeutic activities;Therapeutic exercise;Balance training;Patient/family education;Manual techniques;Passive range of motion;Taping   PT Next Visit Plan assess tolerance to the new HEP. consder adding standing exercises. adjust HEP as tolerated.    PT Home Exercise Plan quad set, glut set, seated hip abduction, seated hip aduction, scpaular retraction, shoulder extension,    Consulted and Agree with Plan of Care Patient      Patient will benefit from skilled therapeutic intervention  in order to improve the following deficits and impairments:  Decreased activity tolerance, Decreased balance, Decreased endurance, Decreased knowledge of use of DME, Decreased mobility, Decreased range of motion, Difficulty walking, Impaired perceived functional ability, Impaired flexibility, Improper body mechanics, Pain  Visit Diagnosis: Muscle weakness (generalized)  Chronic pain of right knee  Other abnormalities of gait and mobility  PHYSICAL THERAPY DISCHARGE SUMMARY  Visits from Start of Care: 5  Current functional level related to goals / functional outcomes: Did not return for last visit but had a significant improvement with pain and mobility    Remaining deficits:  continued pain    Education / Equipment: HEP  Plan: Patient agrees to discharge.  Patient goals were not met. Patient is being discharged due to not returning since the last visit.  ?????       Problem List Patient Active Problem List   Diagnosis Date Noted  . Dyslipidemia 10/12/2016  . Essential hypertension, benign 10/12/2016  . History of phlebitis 10/12/2016  . Unilateral primary osteoarthritis, left hip 10/08/2016  . Status post left hip replacement 10/08/2016    Carney Living PT DPT  03/02/2017, 11:37 AM  [facility] [address] [place], [place], [address] Phone: [phone]   Fax:  [phone]  Name: JALAL RAUCH MRN: [id number] Date of Birth: Jul 27, 1956

## 2017-04-28 ENCOUNTER — Ambulatory Visit (INDEPENDENT_AMBULATORY_CARE_PROVIDER_SITE_OTHER): Payer: Medicaid Other | Admitting: Orthopaedic Surgery

## 2017-04-28 ENCOUNTER — Ambulatory Visit (INDEPENDENT_AMBULATORY_CARE_PROVIDER_SITE_OTHER): Payer: Medicaid Other | Admitting: Physician Assistant

## 2017-05-02 ENCOUNTER — Ambulatory Visit (INDEPENDENT_AMBULATORY_CARE_PROVIDER_SITE_OTHER): Payer: Medicaid Other | Admitting: Physician Assistant

## 2017-05-05 ENCOUNTER — Ambulatory Visit (INDEPENDENT_AMBULATORY_CARE_PROVIDER_SITE_OTHER): Payer: Medicaid Other | Admitting: Physician Assistant

## 2017-05-05 DIAGNOSIS — M1711 Unilateral primary osteoarthritis, right knee: Secondary | ICD-10-CM

## 2017-05-05 NOTE — Progress Notes (Signed)
Office Visit Note   Patient: Kyle Fleming           Date of Birth: 1956/07/12           MRN: [id number] Visit Date: 05/05/2017              Requested by: No referring provider defined for this encounter. PCP: Default, Provider, MD   Assessment & Plan: Visit Diagnoses:  1. Primary osteoarthritis of right knee     Plan: Kyle Fleming this fail conservative treatment which included steroid injections, topical rubs: anti-inflammatories and knee braces. Despite this he continues to have pain in his knee that severely limits his activities. He is 3 months status post a left total hip arthroplasty and is doing very well with this. He comes in today requesting a right total knee arthroplasty in the near future. However he is trying to gain housing as he is living in a shelter at this point in time.  Therefore is given Kyle Fleming's card or scheduler and he will call once he is ready to be placed on the schedule for a right total knee arthroplasty. Today we showed him a right total knee replacement model. Questions were encouraged and answered at length. Risks benefits discussed with him. We'll have him follow up 2 weeks postop.  Follow-Up Instructions: Return for post op 2 weeks.   Orders:  No orders of the defined types were placed in this encounter.  No orders of the defined types were placed in this encounter.     Procedures: No procedures performed   Clinical Data: No additional findings.   Subjective: Right knee pain  HPI Kyle Fleming is now 3 months status post left total hip arthroplasty. He states the left hip is doing very well. He has no complaints. He would like to  discuss having his right knee replaced. He's tried conservative measures for his knee including braces anti-inflammatories, topical, and steroid injection without any real relief. He does ambulate some but writes of some in the wheelchair also due to the right knee pain. He currently lives in a homeless  shelter but it still seemed to be placed in housing. Previous radiographs of the right knee dated 09/17/2016 showed severe degenerative changes in the lateral and patellofemoral compartments. No acute fractures. Review of Systems No chest pain shortness breath fevers chills.  Objective: Vital Signs: There were no vitals taken for this visit.  Physical Exam  Constitutional: He appears well-developed and well-nourished. No distress.  Skin: He is not diaphoretic.    Ortho Exam His right knee lacks a few degrees in full extension. He has a valgus deformity. Tenderness along medial lateral joint line. Crepitus with passive range of motion the knee. Did not appreciate any effusion. His calf is supple nontender. Specialty Comments:  No specialty comments available.  Imaging: No results found.   PMFS History: Patient Active Problem List   Diagnosis Date Noted  . Dyslipidemia 10/12/2016  . Essential hypertension, benign 10/12/2016  . History of phlebitis 10/12/2016  . Unilateral primary osteoarthritis, left hip 10/08/2016  . Status post left hip replacement 10/08/2016   Past Medical History:  Diagnosis Date  . Arthritis   . Hypertension   . Phlebitis    hx of in right leg - 2016     No family history on file.  Past Surgical History:  Procedure Laterality Date  . JOINT REPLACEMENT  2003   right hip   . [facility]   . TOTAL HIP ARTHROPLASTY Left 10/08/2016   Procedure: LEFT TOTAL HIP ARTHROPLASTY ANTERIOR APPROACH;  Surgeon: Kathryne Hitchhristopher Y Blackman, MD;  Location: [facility];  Service: Orthopedics;  Laterality: Left;   Social History   Occupational History  . Not on file.   Social History Main Topics  . Smoking status: Former Smoker    Types: Cigars  . Smokeless tobacco: Never Used  . Alcohol use Yes     Comment: 3 beeers daily on weekends   . Drug use: No  . Sexual activity: Not on file

## 2017-10-13 ENCOUNTER — Other Ambulatory Visit (INDEPENDENT_AMBULATORY_CARE_PROVIDER_SITE_OTHER): Payer: Self-pay | Admitting: Physician Assistant

## 2017-10-24 NOTE — Patient Instructions (Signed)
Kyle Fleming  10/24/2017   Your procedure is scheduled on: 10-28-17   [facility]  [place]tr[place] to 3rd floor to  Short Stay Center at 5:30 AM.   Call this number if you have problems the morning of surgery [phone]    Remember: ONLY 1 PERSON MAY GO WITH YOU TO SHORT STAY TO GET  READY MORNING OF YOUR SURGERY.  Do not eat food or drink liquids :After Midnight.     Take these medicines the morning of surgery with A SIP OF WATER: None                                 You may not have any metal on your body including hair pins and              piercings  Do not wear jewelry, make-up, lotions, powders or  deodorant             Men may shave face and neck.   Do not bring valuables to the hospital. [facility] IS NOT             RESPONSIBLE   FOR VALUABLES.  Contacts, dentures or bridgework may not be worn into surgery.  Leave suitcase in the car. After surgery it may be brought to your room.                Please read over the following fact sheets you were given: _____________________________________________________________________             [facility] - Preparing for Surgery Before surgery, you can play an important role.  Because skin is not sterile, your skin needs to be as free of germs as possible.  You can reduce the number of germs on your skin by washing with CHG (chlorahexidine gluconate) soap before surgery.  CHG is an antiseptic cleaner which kills germs and bonds with the skin to continue killing germs even after washing. Please DO NOT use if you have an allergy to CHG or antibacterial soaps.  If your skin becomes reddened/irritated stop using the CHG and inform your nurse when you arrive at Short Stay. Do not shave (including legs and underarms) for at least 48 hours prior to the first CHG shower.  You may shave your face/neck. Please follow these instructions carefully:  1.  Shower with CHG Soap the  night before surgery and the  morning of Surgery.  2.  If you choose to wash your hair, wash your hair first as usual with your  normal  shampoo.  3.  After you shampoo, rinse your hair and body thoroughly to remove the  shampoo.                           4.  Use CHG as you would any other liquid soap.  You can apply chg directly  to the skin and wash                       Gently with a scrungie or clean washcloth.  5.  Apply the CHG Soap to your body ONLY FROM THE NECK DOWN.   Do not use on face/ open  Wound or open sores. Avoid contact with eyes, ears mouth and genitals (private parts).                       Wash face,  Genitals (private parts) with your normal soap.             6.  Wash thoroughly, paying special attention to the area where your surgery  will be performed.  7.  Thoroughly rinse your body with warm water from the neck down.  8.  DO NOT shower/wash with your normal soap after using and rinsing off  the CHG Soap.                9.  Pat yourself dry with a clean towel.            10.  Wear clean pajamas.            11.  Place clean sheets on your bed the night of your first shower and do not  sleep with pets. Day of Surgery : Do not apply any lotions/deodorants the morning of surgery.  Please wear clean clothes to the hospital/surgery center.  FAILURE TO FOLLOW THESE INSTRUCTIONS MAY RESULT IN THE CANCELLATION OF YOUR SURGERY PATIENT SIGNATURE_________________________________  NURSE SIGNATURE__________________________________  ________________________________________________________________________   Kyle Fleming  An incentive spirometer is a tool that can help keep your lungs clear and active. This tool measures how well you are filling your lungs with each breath. Taking long deep breaths may help reverse or decrease the chance of developing breathing (pulmonary) problems (especially infection) following:  A long period of time when you  are unable to move or be active. BEFORE THE PROCEDURE   If the spirometer includes an indicator to show your best effort, your nurse or respiratory therapist will set it to a desired goal.  If possible, sit up straight or lean slightly forward. Try not to slouch.  Hold the incentive spirometer in an upright position. INSTRUCTIONS FOR USE  1. Sit on the edge of your bed if possible, or sit up as far as you can in bed or on a chair. 2. Hold the incentive spirometer in an upright position. 3. Breathe out normally. 4. Place the mouthpiece in your mouth and seal your lips tightly around it. 5. Breathe in slowly and as deeply as possible, raising the piston or the ball toward the top of the column. 6. Hold your breath for 3-5 seconds or for as long as possible. Allow the piston or ball to fall to the bottom of the column. 7. Remove the mouthpiece from your mouth and breathe out normally. 8. Rest for a few seconds and repeat Steps 1 through 7 at least 10 times every 1-2 hours when you are awake. Take your time and take a few normal breaths between deep breaths. 9. The spirometer may include an indicator to show your best effort. Use the indicator as a goal to work toward during each repetition. 10. After each set of 10 deep breaths, practice coughing to be sure your lungs are clear. If you have an incision (the cut made at the time of surgery), support your incision when coughing by placing a pillow or rolled up towels firmly against it. Once you are able to get out of bed, walk around indoors and cough well. You may stop using the incentive spirometer when instructed by your caregiver.  RISKS AND COMPLICATIONS  Take your time so you do not get  dizzy or light-headed.  If you are in pain, you may need to take or ask for pain medication before doing incentive spirometry. It is harder to take a deep breath if you are having pain. AFTER USE  Rest and breathe slowly and easily.  It can be helpful to  keep track of a log of your progress. Your caregiver can provide you with a simple table to help with this. If you are using the spirometer at home, follow these instructions: [place] IF:   You are having difficultly using the spirometer.  You have trouble using the spirometer as often as instructed.  Your pain medication is not giving enough relief while using the spirometer.  You develop fever of 100.5 F (38.1 C) or higher. SEEK IMMEDIATE MEDICAL CARE IF:   You cough up bloody sputum that had not been present before.  You develop fever of 102 F (38.9 C) or greater.  You develop worsening pain at or near the incision site. MAKE SURE YOU:   Understand these instructions.  Will watch your condition.  Will get help right away if you are not doing well or get worse. Document Released: 03/14/2007 Document Revised: 01/24/2012 Document Reviewed: 05/15/2007 [facility] Patient Information 2014 [place], [place].   ________________________________________________________________________

## 2017-10-25 ENCOUNTER — Other Ambulatory Visit: Payer: Self-pay

## 2017-10-25 ENCOUNTER — Encounter (HOSPITAL_COMMUNITY)
Admission: RE | Admit: 2017-10-25 | Discharge: 2017-10-25 | Disposition: A | Discharge status: Home / Self Care | Payer: Medicaid Other | Source: Ambulatory Visit | Attending: Orthopaedic Surgery | Admitting: Orthopaedic Surgery

## 2017-10-25 ENCOUNTER — Encounter (HOSPITAL_COMMUNITY): Payer: Self-pay

## 2017-10-25 DIAGNOSIS — M1711 Unilateral primary osteoarthritis, right knee: Secondary | ICD-10-CM | POA: Diagnosis not present

## 2017-10-25 DIAGNOSIS — Z01810 Encounter for preprocedural cardiovascular examination: Secondary | ICD-10-CM | POA: Insufficient documentation

## 2017-10-25 DIAGNOSIS — Z01812 Encounter for preprocedural laboratory examination: Secondary | ICD-10-CM | POA: Insufficient documentation

## 2017-10-25 DIAGNOSIS — I10 Essential (primary) hypertension: Secondary | ICD-10-CM | POA: Diagnosis not present

## 2017-10-25 DIAGNOSIS — I517 Cardiomegaly: Secondary | ICD-10-CM | POA: Insufficient documentation

## 2017-10-25 LAB — CBC
HEMATOCRIT: 42.4 % (ref 39.0–52.0)
Hemoglobin: 14.8 g/dL (ref 13.0–17.0)
MCH: 33.7 pg (ref 26.0–34.0)
MCHC: 34.9 g/dL (ref 30.0–36.0)
MCV: 96.6 fL (ref 78.0–100.0)
PLATELETS: 186 10*3/uL (ref 150–400)
RBC: 4.39 MIL/uL (ref 4.22–5.81)
RDW: 12.8 % (ref 11.5–15.5)
WBC: 5.4 10*3/uL (ref 4.0–10.5)

## 2017-10-25 LAB — BASIC METABOLIC PANEL
Anion gap: 10 (ref 5–15)
BUN: 9 mg/dL (ref 6–20)
CO2: 23 mmol/L (ref 22–32)
CREATININE: 0.88 mg/dL (ref 0.61–1.24)
Calcium: 9.9 mg/dL (ref 8.9–10.3)
Chloride: 106 mmol/L (ref 101–111)
GFR calc Af Amer: >60 mL/min (ref >60)
GLUCOSE: 86 mg/dL (ref 65–99)
POTASSIUM: 3.7 mmol/L (ref 3.5–5.1)
Sodium: 139 mmol/L (ref 135–145)

## 2017-10-25 LAB — SURGICAL PCR SCREEN
MRSA, PCR: NEGATIVE
STAPHYLOCOCCUS AUREUS: NEGATIVE

## 2017-10-27 NOTE — Anesthesia Preprocedure Evaluation (Addendum)
Anesthesia Evaluation  Patient identified by MRN, date of birth, ID band Patient awake    Reviewed: Allergy & Precautions, NPO status , Patient's Chart, lab work & pertinent test results  History of Anesthesia Complications Negative for: history of anesthetic complications  Airway Mallampati: II  TM Distance: >3 FB Neck ROM: Full    Dental  (+) Partial Upper, Dental Advisory Given   Pulmonary Current Smoker,    breath sounds clear to auscultation       Cardiovascular hypertension, Pt. on medications (-) angina+ Peripheral Vascular Disease   Rhythm:Regular Rate:Normal     Neuro/Psych negative neurological ROS     GI/Hepatic negative GI ROS, Neg liver ROS,   Endo/Other  Morbid obesity  Renal/GU negative Renal ROS     Musculoskeletal  (+) Arthritis , Osteoarthritis,    Abdominal (+) + obese,   Peds  Hematology plavix   Anesthesia Other Findings   Reproductive/Obstetrics                            Anesthesia Physical Anesthesia Plan  ASA: II  Anesthesia Plan: Spinal   Post-op Pain Management:  Regional for Post-op pain   Induction:   PONV Risk Score and Plan: 1 and Ondansetron and Dexamethasone  Airway Management Planned: Natural Airway and Nasal Cannula  Additional Equipment:   Intra-op Plan:   Post-operative Plan:   Informed Consent: I have reviewed the patients History and Physical, chart, labs and discussed the procedure including the risks, benefits and alternatives for the proposed anesthesia with the patient or authorized representative who has indicated his/her understanding and acceptance.   Dental advisory given  Plan Discussed with: CRNA and Surgeon  Anesthesia Plan Comments: (Plan routine monitors, SAB with adductor canal block for post op analgesia)        Anesthesia Quick Evaluation

## 2017-10-28 ENCOUNTER — Inpatient Hospital Stay (HOSPITAL_COMMUNITY): Payer: Medicaid Other | Admitting: Anesthesiology

## 2017-10-28 ENCOUNTER — Inpatient Hospital Stay (HOSPITAL_COMMUNITY): Payer: Medicaid Other

## 2017-10-28 ENCOUNTER — Encounter (HOSPITAL_COMMUNITY): Payer: Self-pay | Admitting: Anesthesiology

## 2017-10-28 ENCOUNTER — Other Ambulatory Visit: Payer: Self-pay

## 2017-10-28 ENCOUNTER — Inpatient Hospital Stay (HOSPITAL_COMMUNITY)
Admission: RE | Admit: 2017-10-28 | Discharge: 2017-10-31 | DRG: 470 | Disposition: A | Discharge status: Skilled Nursing Facility | Payer: Medicaid Other | Source: Ambulatory Visit | Attending: Orthopaedic Surgery | Admitting: Orthopaedic Surgery

## 2017-10-28 ENCOUNTER — Encounter (HOSPITAL_COMMUNITY)
Admission: RE | Disposition: A | Discharge status: Skilled Nursing Facility | Payer: Self-pay | Source: Ambulatory Visit | Attending: Orthopaedic Surgery

## 2017-10-28 DIAGNOSIS — M1711 Unilateral primary osteoarthritis, right knee: Secondary | ICD-10-CM | POA: Diagnosis present

## 2017-10-28 DIAGNOSIS — Z96651 Presence of right artificial knee joint: Secondary | ICD-10-CM

## 2017-10-28 DIAGNOSIS — Z6831 Body mass index (BMI) 31.0-31.9, adult: Secondary | ICD-10-CM

## 2017-10-28 DIAGNOSIS — Z87891 Personal history of nicotine dependence: Secondary | ICD-10-CM

## 2017-10-28 DIAGNOSIS — E785 Hyperlipidemia, unspecified: Secondary | ICD-10-CM | POA: Diagnosis present

## 2017-10-28 DIAGNOSIS — I10 Essential (primary) hypertension: Secondary | ICD-10-CM | POA: Diagnosis present

## 2017-10-28 DIAGNOSIS — M25561 Pain in right knee: Secondary | ICD-10-CM | POA: Diagnosis present

## 2017-10-28 DIAGNOSIS — Z96642 Presence of left artificial hip joint: Secondary | ICD-10-CM | POA: Diagnosis present

## 2017-10-28 HISTORY — PX: TOTAL KNEE ARTHROPLASTY: SHX125

## 2017-10-28 SURGERY — ARTHROPLASTY, KNEE, TOTAL
Anesthesia: Spinal | Laterality: Right

## 2017-10-28 MED ORDER — PROPOFOL 10 MG/ML IV BOLUS
INTRAVENOUS | Status: AC
  Filled 2017-10-28: qty 20

## 2017-10-28 MED ORDER — METOCLOPRAMIDE HCL 5 MG/ML IJ SOLN: 5.0000 mg | Freq: Three times a day (TID) | INTRAMUSCULAR | Status: DC | PRN

## 2017-10-28 MED ORDER — PHENYLEPHRINE HCL 10 MG/ML IJ SOLN
INTRAVENOUS | Status: DC | PRN
  Administered 2017-10-28: 50 ug/min via INTRAVENOUS

## 2017-10-28 MED ORDER — ADULT MULTIVITAMIN W/MINERALS CH
1.0000 | ORAL_TABLET | Freq: Every day | ORAL | Status: DC
  Administered 2017-10-29 – 2017-10-31 (×3): 1 via ORAL
  Filled 2017-10-28 (×3): qty 1

## 2017-10-28 MED ORDER — DEXAMETHASONE SODIUM PHOSPHATE 10 MG/ML IJ SOLN
INTRAMUSCULAR | Status: DC | PRN
  Administered 2017-10-28: 10 mg via INTRAVENOUS

## 2017-10-28 MED ORDER — FENTANYL CITRATE (PF) 100 MCG/2ML IJ SOLN
INTRAMUSCULAR | Status: AC
  Filled 2017-10-28: qty 2

## 2017-10-28 MED ORDER — CLINDAMYCIN PHOSPHATE 900 MG/50ML IV SOLN
INTRAVENOUS | Status: AC
  Filled 2017-10-28: qty 50

## 2017-10-28 MED ORDER — LACTATED RINGERS IV SOLN
INTRAVENOUS | Status: DC
  Administered 2017-10-28: 1000 mL via INTRAVENOUS

## 2017-10-28 MED ORDER — SODIUM CHLORIDE 0.9 % IJ SOLN
INTRAMUSCULAR | Status: AC
  Filled 2017-10-28: qty 10

## 2017-10-28 MED ORDER — DEXAMETHASONE SODIUM PHOSPHATE 10 MG/ML IJ SOLN
INTRAMUSCULAR | Status: AC
  Filled 2017-10-28: qty 1

## 2017-10-28 MED ORDER — PHENYLEPHRINE 40 MCG/ML (10ML) SYRINGE FOR IV PUSH (FOR BLOOD PRESSURE SUPPORT)
PREFILLED_SYRINGE | INTRAVENOUS | Status: AC
  Filled 2017-10-28: qty 10

## 2017-10-28 MED ORDER — ONDANSETRON HCL 4 MG/2ML IJ SOLN: 4.0000 mg | Freq: Four times a day (QID) | INTRAMUSCULAR | Status: DC | PRN

## 2017-10-28 MED ORDER — SUFENTANIL CITRATE 50 MCG/ML IV SOLN
INTRAVENOUS | Status: AC
  Filled 2017-10-28: qty 1

## 2017-10-28 MED ORDER — MIDAZOLAM HCL 2 MG/2ML IJ SOLN
INTRAMUSCULAR | Status: AC
  Filled 2017-10-28: qty 2

## 2017-10-28 MED ORDER — HYDROMORPHONE HCL 1 MG/ML IJ SOLN: 0.2500 mg | INTRAMUSCULAR | Status: DC | PRN

## 2017-10-28 MED ORDER — OXYCODONE HCL 5 MG PO TABS
10.0000 mg | ORAL_TABLET | ORAL | Status: DC | PRN
  Administered 2017-10-28 – 2017-10-30 (×15): 15 mg via ORAL
  Administered 2017-10-30: 14:00:00 10 mg via ORAL
  Administered 2017-10-31 (×4): 15 mg via ORAL
  Filled 2017-10-28 (×11): qty 3
  Filled 2017-10-28: qty 2
  Filled 2017-10-28 (×8): qty 3

## 2017-10-28 MED ORDER — MIDAZOLAM HCL 2 MG/2ML IJ SOLN: 0.5000 mg | Freq: Once | INTRAMUSCULAR | Status: DC | PRN

## 2017-10-28 MED ORDER — SODIUM CHLORIDE 0.9 % IR SOLN
Status: DC | PRN
  Administered 2017-10-28: 3000 mL

## 2017-10-28 MED ORDER — METOCLOPRAMIDE HCL 5 MG PO TABS: 5.0000 mg | ORAL_TABLET | Freq: Three times a day (TID) | ORAL | Status: DC | PRN

## 2017-10-28 MED ORDER — VITAMIN D (ERGOCALCIFEROL) 1.25 MG (50000 UNIT) PO CAPS
50000.0000 [IU] | ORAL_CAPSULE | ORAL | Status: DC
  Administered 2017-10-31: 50000 [IU] via ORAL
  Filled 2017-10-28: qty 1

## 2017-10-28 MED ORDER — HYDROCODONE-ACETAMINOPHEN 5-325 MG PO TABS: 1.0000 | ORAL_TABLET | ORAL | Status: DC | PRN

## 2017-10-28 MED ORDER — DIPHENHYDRAMINE HCL 12.5 MG/5ML PO ELIX: 12.5000 mg | ORAL_SOLUTION | ORAL | Status: DC | PRN

## 2017-10-28 MED ORDER — ROPIVACAINE HCL 7.5 MG/ML IJ SOLN
INTRAMUSCULAR | Status: DC | PRN
  Administered 2017-10-28: 20 mL via PERINEURAL

## 2017-10-28 MED ORDER — LIDOCAINE 2% (20 MG/ML) 5 ML SYRINGE
INTRAMUSCULAR | Status: AC
  Filled 2017-10-28: qty 5

## 2017-10-28 MED ORDER — CLINDAMYCIN PHOSPHATE 600 MG/50ML IV SOLN
600.0000 mg | Freq: Four times a day (QID) | INTRAVENOUS | Status: AC
  Administered 2017-10-28 (×2): 600 mg via INTRAVENOUS
  Filled 2017-10-28 (×2): qty 50

## 2017-10-28 MED ORDER — TRANEXAMIC ACID 1000 MG/10ML IV SOLN
1000.0000 mg | INTRAVENOUS | Status: AC
  Administered 2017-10-28: 1000 mg via INTRAVENOUS
  Filled 2017-10-28: qty 1100

## 2017-10-28 MED ORDER — POLYETHYLENE GLYCOL 3350 17 G PO PACK: 17.0000 g | PACK | Freq: Every day | ORAL | Status: DC | PRN

## 2017-10-28 MED ORDER — METHOCARBAMOL 500 MG PO TABS
500.0000 mg | ORAL_TABLET | Freq: Four times a day (QID) | ORAL | Status: DC | PRN
  Administered 2017-10-28 – 2017-10-31 (×8): 500 mg via ORAL
  Filled 2017-10-28 (×8): qty 1

## 2017-10-28 MED ORDER — METHOCARBAMOL 1000 MG/10ML IJ SOLN
500.0000 mg | Freq: Four times a day (QID) | INTRAVENOUS | Status: DC | PRN
  Administered 2017-10-28: 500 mg via INTRAVENOUS
  Filled 2017-10-28: qty 550

## 2017-10-28 MED ORDER — ASPIRIN 81 MG PO CHEW
81.0000 mg | CHEWABLE_TABLET | Freq: Every day | ORAL | Status: DC
  Administered 2017-10-28 – 2017-10-31 (×4): 81 mg via ORAL
  Filled 2017-10-28 (×4): qty 1

## 2017-10-28 MED ORDER — CLOPIDOGREL BISULFATE 75 MG PO TABS
75.0000 mg | ORAL_TABLET | Freq: Every day | ORAL | Status: DC
  Administered 2017-10-29 – 2017-10-31 (×3): 75 mg via ORAL
  Filled 2017-10-28 (×3): qty 1

## 2017-10-28 MED ORDER — MIDAZOLAM HCL 5 MG/5ML IJ SOLN
INTRAMUSCULAR | Status: DC | PRN
  Administered 2017-10-28: 2 mg via INTRAVENOUS

## 2017-10-28 MED ORDER — HYDROCHLOROTHIAZIDE 25 MG PO TABS
25.0000 mg | ORAL_TABLET | Freq: Every day | ORAL | Status: DC
Start: 2017-10-29 — End: 2017-10-31
  Administered 2017-10-29 – 2017-10-31 (×3): 25 mg via ORAL
  Filled 2017-10-28 (×3): qty 1

## 2017-10-28 MED ORDER — ZOLPIDEM TARTRATE 5 MG PO TABS
5.0000 mg | ORAL_TABLET | Freq: Every evening | ORAL | Status: DC | PRN
  Administered 2017-10-28 – 2017-10-30 (×2): 5 mg via ORAL
  Filled 2017-10-28 (×2): qty 1

## 2017-10-28 MED ORDER — PROPOFOL 10 MG/ML IV BOLUS
INTRAVENOUS | Status: DC | PRN
  Administered 2017-10-28 (×3): 20 mg via INTRAVENOUS

## 2017-10-28 MED ORDER — PHENYLEPHRINE HCL 10 MG/ML IJ SOLN
INTRAMUSCULAR | Status: AC
  Filled 2017-10-28: qty 1

## 2017-10-28 MED ORDER — FENTANYL CITRATE (PF) 100 MCG/2ML IJ SOLN
50.0000 ug | INTRAMUSCULAR | Status: DC | PRN
  Administered 2017-10-28: 50 ug via INTRAVENOUS

## 2017-10-28 MED ORDER — SUCCINYLCHOLINE CHLORIDE 200 MG/10ML IV SOSY
PREFILLED_SYRINGE | INTRAVENOUS | Status: AC
  Filled 2017-10-28: qty 10

## 2017-10-28 MED ORDER — ACETAMINOPHEN 325 MG PO TABS
650.0000 mg | ORAL_TABLET | ORAL | Status: DC | PRN
  Administered 2017-10-29 – 2017-10-30 (×4): 650 mg via ORAL
  Filled 2017-10-28 (×4): qty 2

## 2017-10-28 MED ORDER — DOCUSATE SODIUM 100 MG PO CAPS
100.0000 mg | ORAL_CAPSULE | Freq: Two times a day (BID) | ORAL | Status: DC
  Administered 2017-10-28 – 2017-10-31 (×6): 100 mg via ORAL
  Filled 2017-10-28 (×6): qty 1

## 2017-10-28 MED ORDER — CHLORHEXIDINE GLUCONATE 4 % EX LIQD: 60.0000 mL | Freq: Once | CUTANEOUS | Status: DC

## 2017-10-28 MED ORDER — CLINDAMYCIN PHOSPHATE 900 MG/50ML IV SOLN
900.0000 mg | INTRAVENOUS | Status: AC
  Administered 2017-10-28: 900 mg via INTRAVENOUS

## 2017-10-28 MED ORDER — SODIUM CHLORIDE 0.9 % IV SOLN
INTRAVENOUS | Status: DC
  Administered 2017-10-28: 1000 mL via INTRAVENOUS
  Administered 2017-10-28: 23:00:00 via INTRAVENOUS

## 2017-10-28 MED ORDER — PHENOL 1.4 % MT LIQD: 1.0000 | OROMUCOSAL | Status: DC | PRN

## 2017-10-28 MED ORDER — MENTHOL 3 MG MT LOZG: 1.0000 | LOZENGE | OROMUCOSAL | Status: DC | PRN

## 2017-10-28 MED ORDER — LACTATED RINGERS IV SOLN
INTRAVENOUS | Status: DC | PRN
  Administered 2017-10-28 (×2): via INTRAVENOUS

## 2017-10-28 MED ORDER — ROCURONIUM BROMIDE 50 MG/5ML IV SOSY
PREFILLED_SYRINGE | INTRAVENOUS | Status: AC
  Filled 2017-10-28: qty 5

## 2017-10-28 MED ORDER — MIDAZOLAM HCL 2 MG/2ML IJ SOLN
1.0000 mg | INTRAMUSCULAR | Status: DC | PRN
  Administered 2017-10-28: 2 mg via INTRAVENOUS

## 2017-10-28 MED ORDER — ACETAMINOPHEN 650 MG RE SUPP: 650.0000 mg | RECTAL | Status: DC | PRN

## 2017-10-28 MED ORDER — PROMETHAZINE HCL 25 MG/ML IJ SOLN: 6.2500 mg | INTRAMUSCULAR | Status: DC | PRN

## 2017-10-28 MED ORDER — PHENYLEPHRINE 40 MCG/ML (10ML) SYRINGE FOR IV PUSH (FOR BLOOD PRESSURE SUPPORT)
PREFILLED_SYRINGE | INTRAVENOUS | Status: DC | PRN
  Administered 2017-10-28 (×3): 80 ug via INTRAVENOUS

## 2017-10-28 MED ORDER — ALUM & MAG HYDROXIDE-SIMETH 200-200-20 MG/5ML PO SUSP
30.0000 mL | ORAL | Status: DC | PRN
  Administered 2017-10-28: 30 mL via ORAL
  Filled 2017-10-28 (×2): qty 30

## 2017-10-28 MED ORDER — PROPOFOL 500 MG/50ML IV EMUL
INTRAVENOUS | Status: DC | PRN
  Administered 2017-10-28: 50 ug/kg/min via INTRAVENOUS

## 2017-10-28 MED ORDER — HYDROMORPHONE HCL 1 MG/ML IJ SOLN
1.0000 mg | INTRAMUSCULAR | Status: DC | PRN
  Administered 2017-10-28 – 2017-10-29 (×4): 1 mg via INTRAVENOUS
  Filled 2017-10-28 (×4): qty 1

## 2017-10-28 MED ORDER — ONDANSETRON HCL 4 MG/2ML IJ SOLN
INTRAMUSCULAR | Status: AC
  Filled 2017-10-28: qty 2

## 2017-10-28 MED ORDER — BUPIVACAINE IN DEXTROSE 0.75-8.25 % IT SOLN
INTRATHECAL | Status: DC | PRN
  Administered 2017-10-28: 2 mL via INTRATHECAL

## 2017-10-28 MED ORDER — ONDANSETRON HCL 4 MG PO TABS: 4.0000 mg | ORAL_TABLET | Freq: Four times a day (QID) | ORAL | Status: DC | PRN

## 2017-10-28 MED ORDER — PROPOFOL 10 MG/ML IV BOLUS
INTRAVENOUS | Status: AC
  Filled 2017-10-28: qty 40

## 2017-10-28 MED ORDER — MEPERIDINE HCL 50 MG/ML IJ SOLN: 6.2500 mg | INTRAMUSCULAR | Status: DC | PRN

## 2017-10-28 SURGICAL SUPPLY — 47 items
BAG SPEC THK2 15X12 ZIP CLS (MISCELLANEOUS)
BAG ZIPLOCK 12X15 (MISCELLANEOUS) IMPLANT
BANDAGE ACE 6X5 VEL STRL LF (GAUZE/BANDAGES/DRESSINGS) ×3 IMPLANT
BENZOIN TINCTURE PRP APPL 2/3 (GAUZE/BANDAGES/DRESSINGS) IMPLANT
BLADE SAG 18X100X1.27 (BLADE) ×3 IMPLANT
BOWL SMART MIX CTS (DISPOSABLE) ×3 IMPLANT
CAPT KNEE TOTAL 3 ×3 IMPLANT
CEMENT BONE SIMPLEX SPEEDSET (Cement) ×6 IMPLANT
CLOSURE WOUND 1/2 X4 (GAUZE/BANDAGES/DRESSINGS)
COVER SURGICAL LIGHT HANDLE (MISCELLANEOUS) ×3 IMPLANT
CUFF TOURN SGL QUICK 34 (TOURNIQUET CUFF) ×3
CUFF TRNQT CYL 34X4X40X1 (TOURNIQUET CUFF) ×1 IMPLANT
DRAPE U-SHAPE 47X51 STRL (DRAPES) ×3 IMPLANT
DRSG AQUACEL AG ADV 3.5X10 (GAUZE/BANDAGES/DRESSINGS) ×3 IMPLANT
DRSG PAD ABDOMINAL 8X10 ST (GAUZE/BANDAGES/DRESSINGS) ×3 IMPLANT
DURAPREP 26ML APPLICATOR (WOUND CARE) ×3 IMPLANT
ELECT REM PT RETURN 15FT ADLT (MISCELLANEOUS) ×3 IMPLANT
GAUZE SPONGE 4X4 12PLY STRL (GAUZE/BANDAGES/DRESSINGS) ×3 IMPLANT
GAUZE XEROFORM 1X8 LF (GAUZE/BANDAGES/DRESSINGS) ×3 IMPLANT
GLOVE BIO SURGEON STRL SZ7.5 (GLOVE) ×3 IMPLANT
GLOVE BIOGEL PI IND STRL 8 (GLOVE) ×2 IMPLANT
GLOVE BIOGEL PI INDICATOR 8 (GLOVE) ×4
GLOVE ECLIPSE 8.0 STRL XLNG CF (GLOVE) ×3 IMPLANT
GOWN STRL REUS W/TWL XL LVL3 (GOWN DISPOSABLE) ×6 IMPLANT
HANDPIECE INTERPULSE COAX TIP (DISPOSABLE) ×2
IMMOBILIZER KNEE 20 (SOFTGOODS) ×3
IMMOBILIZER KNEE 20 THIGH 36 (SOFTGOODS) ×1 IMPLANT
NS IRRIG 1000ML POUR BTL (IV SOLUTION) ×3 IMPLANT
PACK TOTAL KNEE CUSTOM (KITS) ×3 IMPLANT
PAD ABD 8X10 STRL (GAUZE/BANDAGES/DRESSINGS) ×6 IMPLANT
PADDING CAST COTTON 6X4 STRL (CAST SUPPLIES) ×6 IMPLANT
POSITIONER SURGICAL ARM (MISCELLANEOUS) ×3 IMPLANT
SET HNDPC FAN SPRY TIP SCT (DISPOSABLE) ×1 IMPLANT
SET PAD KNEE POSITIONER (MISCELLANEOUS) ×3 IMPLANT
STAPLER VISISTAT 35W (STAPLE) IMPLANT
STRIP CLOSURE SKIN 1/2X4 (GAUZE/BANDAGES/DRESSINGS) IMPLANT
SUT MNCRL AB 4-0 PS2 18 (SUTURE) IMPLANT
SUT VIC AB 0 CT1 27 (SUTURE) ×2
SUT VIC AB 0 CT1 27XBRD ANTBC (SUTURE) ×1 IMPLANT
SUT VIC AB 1 CT1 27 (SUTURE) ×4
SUT VIC AB 1 CT1 27XBRD ANTBC (SUTURE) ×2 IMPLANT
SUT VIC AB 2-0 CT1 27 (SUTURE) ×6
SUT VIC AB 2-0 CT1 TAPERPNT 27 (SUTURE) ×2 IMPLANT
TRAY FOLEY W/METER SILVER 16FR (SET/KITS/TRAYS/PACK) ×3 IMPLANT
WATER STERILE IRR 1000ML POUR (IV SOLUTION) ×6 IMPLANT
WRAP KNEE MAXI GEL POST OP (GAUZE/BANDAGES/DRESSINGS) ×3 IMPLANT
YANKAUER SUCT BULB TIP 10FT TU (MISCELLANEOUS) ×3 IMPLANT

## 2017-10-28 NOTE — Evaluation (Signed)
Physical Therapy Evaluation Patient Details Name: Kyle Fleming MRN: [id number] DOB: Jul 02, 1956 Today's Date: 10/28/2017   History of Present Illness  Pt s/p R TKR and with hx of bil THR  Clinical Impression  Pt s/p R TKR and presents with decreased R LE strength/ROM and post op pain limiting functional mobility.  Pt would benefit from follow up rehab at SNF level to maximize IND and safety prior to return home alone.    Follow Up Recommendations SNF    Equipment Recommendations  None recommended by PT    Recommendations for Other Services       Precautions / Restrictions Precautions Precautions: Fall;Knee Required Braces or Orthoses: Knee Immobilizer - Right Knee Immobilizer - Right: Discontinue once straight leg raise with < 10 degree lag Restrictions Weight Bearing Restrictions: No Other Position/Activity Restrictions: WBAT      Mobility  Bed Mobility Overal bed mobility: Needs Assistance Bed Mobility: Supine to Sit     Supine to sit: Min assist     General bed mobility comments: cues for sequence and use of L LE to self assist  Transfers Overall transfer level: Needs assistance Equipment used: Rolling walker (2 wheeled) Transfers: Sit to/from Stand Sit to Stand: Min assist;Mod assist         General transfer comment: cues for LE management and use of UEs to self assist  Ambulation/Gait Ambulation/Gait assistance: Min assist Ambulation Distance (Feet): 38 Feet Assistive device: Rolling walker (2 wheeled) Gait Pattern/deviations: Step-to pattern;Decreased step length - right;Decreased step length - left;Shuffle;Trunk flexed Gait velocity: decr Gait velocity interpretation: Below normal speed for age/gender General Gait Details: cues for sequence, posture and position from [employer]  Stairs            Wheelchair Mobility    Modified Rankin (Stroke Patients Only)       Balance                                              Pertinent Vitals/Pain Pain Assessment: 0-10 Pain Score: 10-Worst pain ever Pain Location: R knee Pain Descriptors / Indicators: Aching;Sore Pain Intervention(s): Limited activity within patient's tolerance;Monitored during session;Premedicated before session;Ice applied;Patient requesting pain meds-RN notified    Home Living Family/patient expects to be discharged to:: Skilled nursing facility Living Arrangements: Alone                    Prior Function Level of Independence: Independent with assistive device(s)         Comments: Pt ambulated ltd distances pushing wc     Hand Dominance        Extremity/Trunk Assessment   Upper Extremity Assessment Upper Extremity Assessment: Overall WFL for tasks assessed    Lower Extremity Assessment Lower Extremity Assessment: RLE deficits/detail       Communication   Communication: No difficulties  Cognition Arousal/Alertness: Awake/alert Behavior During Therapy: WFL for tasks assessed/performed Overall Cognitive Status: Within Functional Limits for tasks assessed                                        General Comments      Exercises Total Joint Exercises Ankle Circles/Pumps: AROM;Both;15 reps;Supine   Assessment/Plan    PT Assessment Patient needs continued PT services  PT Problem List Decreased  strength;Decreased range of motion;Decreased activity tolerance;Decreased mobility;Decreased knowledge of use of DME;Pain       PT Treatment Interventions DME instruction;Gait training;Stair training;Functional mobility training;Therapeutic activities;Therapeutic exercise;Patient/family education    PT Goals (Current goals can be found in the Care Plan section)  Acute Rehab PT Goals Patient Stated Goal: Rehab and the home and walking sans AD PT Goal Formulation: With patient Time For Goal Achievement: 11/04/17 Potential to Achieve Goals: Good    Frequency 7X/week   Barriers to discharge  Decreased caregiver support lives alone    Co-evaluation               AM-PAC PT "6 Clicks" Daily Activity  Outcome Measure Difficulty turning over in bed (including adjusting bedclothes, sheets and blankets)?: Unable Difficulty moving from lying on back to sitting on the side of the bed? : Unable Difficulty sitting down on and standing up from a chair with arms (e.g., wheelchair, bedside commode, etc,.)?: Unable Help needed moving to and from a bed to chair (including a wheelchair)?: A Lot Help needed walking in hospital room?: A Little Help needed climbing 3-5 steps with a railing? : A Lot 6 Click Score: 10    End of Session Equipment Utilized During Treatment: Gait belt Activity Tolerance: Patient tolerated treatment well Patient left: in chair;with call bell/phone within reach;with chair alarm set Nurse Communication: Mobility status PT Visit Diagnosis: Difficulty in walking, not elsewhere classified (R26.2);Pain Pain - Right/Left: Right Pain - part of body: Knee    Time: [id number] PT Time Calculation (min) (ACUTE ONLY): 28 min   Charges:   PT Evaluation $PT Eval Low Complexity: 1 Low PT Treatments $Gait Training: 8-22 mins   PT G Codes:        Pg [phone]   Nickie Deren 10/28/2017, 5:47 PM

## 2017-10-28 NOTE — H&P (Signed)
TOTAL KNEE ADMISSION H&P  Patient is being admitted for right total knee arthroplasty.  Subjective:  Chief Complaint:right knee pain.  HPI: Kyle Fleming, 61 y.o. male, has a history of pain and functional disability in the right knee due to arthritis and has failed non-surgical conservative treatments for greater than 12 weeks to includeNSAID's and/or analgesics, corticosteriod injections, viscosupplementation injections, flexibility and strengthening excercises, use of assistive devices, weight reduction as appropriate and activity modification.  Onset of symptoms was gradual, starting 3 years ago with gradually worsening course since that time. The patient noted no past surgery on the right knee(s).  Patient currently rates pain in the right knee(s) at 10 out of 10 with activity. Patient has night pain, worsening of pain with activity and weight bearing, pain that interferes with activities of daily living, pain with passive range of motion, crepitus and joint swelling.  Patient has evidence of subchondral sclerosis, periarticular osteophytes and joint space narrowing by imaging studies. There is no active infection.  Patient Active Problem List   Diagnosis Date Noted  . Status post total right knee replacement 10/28/2017  . Dyslipidemia 10/12/2016  . Essential hypertension, benign 10/12/2016  . History of phlebitis 10/12/2016  . Unilateral primary osteoarthritis, left hip 10/08/2016  . Status post left hip replacement 10/08/2016   Past Medical History:  Diagnosis Date  . Arthritis   . Hypertension   . Phlebitis    hx of in right leg - 2016     Past Surgical History:  Procedure Laterality Date  . JOINT REPLACEMENT  2003   right hip   . st[facility] in right leg      [facility]   . TOTAL HIP ARTHROPLASTY Left 10/08/2016   Procedure: LEFT TOTAL HIP ARTHROPLASTY ANTERIOR APPROACH;  Surgeon: Kathryne Hitchhristopher Y Chrisette Man, MD;  Location: [facility] ORS;  Service: Orthopedics;  Laterality:  Left;    Current Facility-Administered Medications  Medication Dose Route Frequency Provider Last Rate Last Dose  . chlorhexidine (HIBICLENS) 4 % liquid 4 application  60 mL Topical Once Richardean Canallark, Gilbert W, PA-C      . clindamycin (CLEOCIN) 900 MG/50ML IVPB           . clindamycin (CLEOCIN) IVPB 900 mg  900 mg Intravenous On Call to OR Kirtland Bouchardlark, Gilbert W, PA-C      . fentaNYL (SUBLIMAZE) injection 50 mcg  50 mcg Intravenous Q5 min PRN Jairo BenJackson, Carswell, MD   50 mcg at 10/28/17 0707  . lactated ringers infusion   Intravenous Continuous Jairo BenJackson, Carswell, MD   Stopped at 10/28/17 [phone]  . midazolam (VERSED) injection 1-2 mg  1-2 mg Intravenous Q5 min PRN Jairo BenJackson, Carswell, MD   2 mg at 10/28/17 0707  . tranexamic acid (CYKLOKAPRON) 1,000 mg in sodium chloride 0.9 % 100 mL IVPB  1,000 mg Intravenous To OR Kirtland Bouchardlark, Gilbert W, PA-C       Facility-Administered Medications Ordered in Other Encounters  Medication Dose Route Frequency Provider Last Rate Last Dose  . lactated ringers infusion    Continuous PRN Florene Routeeardon, Diana L, CRNA      . ropivacaine (PF) 7.5 mg/mL (0.75%) (NAROPIN) injection    Anesthesia Intra-op Jairo BenJackson, Carswell, MD   20 mL at 10/28/17 0714   Allergies  Allergen Reactions  . Amitriptyline Other (See Comments)    Confusion, out of this world feeling  . Naproxen Other (See Comments)    Doesn't work for patient  . Penicillins Rash    Has patient had a  PCN reaction causing immediate rash, facial/tongue/throat swelling, SOB or lightheadedness with hypotension: no Has patient had a PCN reaction causing severe rash involving mucus membranes or skin necrosis: no Has patient had a PCN reaction that required hospitalization no Has patient had a PCN reaction occurring within the last 10 years: no If all of the above answers are "NO", then may proceed with Cephalosporin use.    Social History   Tobacco Use  . Smoking status: Former Smoker    Types: Cigars  . Smokeless tobacco: Never Used   Substance Use Topics  . Alcohol use: Yes    Comment: 3 beeers daily on weekends     History reviewed. No pertinent family history.   Review of Systems  Musculoskeletal: Positive for joint pain.  All other systems reviewed and are negative.   Objective:  Physical Exam  Constitutional: He is oriented to person, place, and time. He appears well-developed and well-nourished.  HENT:  Head: Normocephalic and atraumatic.  Eyes: EOM are normal. Pupils are equal, round, and reactive to light.  Neck: Normal range of motion. Neck supple.  Cardiovascular: Normal rate and regular rhythm.  Respiratory: Effort normal and breath sounds normal.  GI: Soft. Bowel sounds are normal.  Musculoskeletal:       Right knee: He exhibits decreased range of motion, swelling, effusion and abnormal alignment. Tenderness found. Medial joint line and lateral joint line tenderness noted.  Neurological: He is alert and oriented to person, place, and time.  Skin: Skin is warm and dry.  Psychiatric: He has a normal mood and affect.    Vital signs in last 24 hours: Temp:  [98.8 F (37.1 C)] 98.8 F (37.1 C) (12/14 0601) Pulse Rate:  [88] 88 (12/14 0601) Resp:  [18] 18 (12/14 0601) BP: (139)/(90) 139/90 (12/14 0601) SpO2:  [96 %] 96 % (12/14 0601) Weight:  [210 lb (95.3 kg)] 210 lb (95.3 kg) (12/14 0628)  Labs:   Estimated body mass index is 31.93 kg/m as calculated from the following:   Height as of this encounter: 5\' 8"  (1.727 m).   Weight as of this encounter: 210 lb (95.3 kg).   Imaging Review Plain radiographs demonstrate severe degenerative joint disease of the right knee(s). The overall alignment ismild varus. The bone quality appears to be good for age and reported activity level.  Assessment/Plan:  End stage arthritis, right knee   The patient history, physical examination, clinical judgment of the provider and imaging studies are consistent with end stage degenerative joint disease of the  right knee(s) and total knee arthroplasty is deemed medically necessary. The treatment options including medical management, injection therapy arthroscopy and arthroplasty were discussed at length. The risks and benefits of total knee arthroplasty were presented and reviewed. The risks due to aseptic loosening, infection, stiffness, patella tracking problems, thromboembolic complications and other imponderables were discussed. The patient acknowledged the explanation, agreed to proceed with the plan and consent was signed. Patient is being admitted for inpatient treatment for surgery, pain control, PT, OT, prophylactic antibiotics, VTE prophylaxis, progressive ambulation and ADL's and discharge planning. The patient is planning to be discharged home with home health services

## 2017-10-28 NOTE — Brief Op Note (Signed)
10/28/2017  9:20 AM  PATIENT:  Delorise ShinerBroaderick D Biddinger  61 y.o. male  PRE-OPERATIVE DIAGNOSIS:  Osteoarthritis Right Knee  POST-OPERATIVE DIAGNOSIS:  Osteoarthritis Right Knee  PROCEDURE:  Procedure(s): RIGHT TOTAL KNEE ARTHROPLASTY (Right)  SURGEON:  Surgeon(s) and Role:    Kathryne Hitch* Lamya Lausch Y, MD - Primary  PHYSICIAN ASSISTANT: Rexene EdisonGil Clark, PA-C  ANESTHESIA:   regional and spinal  EBL:  100 mL   COUNTS:  YES  TOURNIQUET:  * Missing tourniquet times found for documented tourniquets in log: [id number] *  DICTATION: .Other Dictation: Dictation Number [id number]  PLAN OF CARE: Admit to inpatient   PATIENT DISPOSITION:  PACU - hemodynamically stable.   Delay start of Pharmacological VTE agent (>24hrs) due to surgical blood loss or risk of bleeding: no

## 2017-10-28 NOTE — Transfer of Care (Signed)
Immediate Anesthesia Transfer of Care Note  Patient: Kyle Fleming  Procedure(s) Performed: RIGHT TOTAL KNEE ARTHROPLASTY (Right )  Patient Location: PACU  Anesthesia Type:Regional and Spinal  Level of Consciousness: awake  Airway & Oxygen Therapy: Patient Spontanous Breathing and Patient connected to face mask oxygen  Post-op Assessment: Report given to RN and Post -op Vital signs reviewed and stable  Post vital signs: Reviewed and stable  Last Vitals:  Vitals:   10/28/17 0601  BP: 139/90  Pulse: 88  Resp: 18  Temp: 37.1 C  SpO2: 96%    Last Pain:  Vitals:   10/28/17 0601  TempSrc: Oral      Patients Stated Pain Goal: 4 (10/28/17 [id number])  Complications: No apparent anesthesia complications

## 2017-10-28 NOTE — Anesthesia Procedure Notes (Signed)
Spinal  Patient location during procedure: OR Start time: 10/28/2017 7:43 AM End time: 10/28/2017 7:49 AM Staffing Resident/CRNA: Sharlette Dense, CRNA Performed: resident/CRNA  Preanesthetic Checklist Completed: patient identified, site marked, surgical consent, pre-op evaluation, timeout performed, IV checked, risks and benefits discussed and monitors and equipment checked Spinal Block Patient position: sitting Prep: Betadine Patient monitoring: heart rate, continuous pulse ox and blood pressure Approach: midline Location: L4-5 Injection technique: single-shot Needle Needle type: Sprotte  Needle gauge: 24 G Needle length: 9 cm Additional Notes Kit expiration date 05/15/2019 and lot #[id number] Clear free flow CSF, negative heme, negative paresthesia Tolerated well and returned to supine position

## 2017-10-28 NOTE — Anesthesia Procedure Notes (Signed)
Anesthesia Regional Block: Adductor canal block   Pre-Anesthetic Checklist: ,, timeout performed, Correct Patient, Correct Site, Correct Laterality, Correct Procedure, Correct Position, site marked, Risks and benefits discussed,  Surgical consent,  Pre-op evaluation,  At surgeon's request and post-op pain management  Laterality: Right and Lower  Prep: chloraprep       Needles:  Injection technique: Single-shot  Needle Type: Echogenic Needle     Needle Length: 9cm  Needle Gauge: 21     Additional Needles:   Procedures:,,,, ultrasound used (permanent image in chart),,,,  Narrative:  Start time: 10/28/2017 7:08 AM End time: 10/28/2017 7:14 AM Injection made incrementally with aspirations every 5 mL.  Performed by: Personally  Anesthesiologist: Jairo BenJackson, Cowan Pilar, MD  Additional Notes: Pt identified in Holding room.  Monitors applied. Working IV access confirmed. Sterile prep, drape R thigh.  #21ga ECHOgenic needle into adductor canal with US guidance.  20cc 0.75% Ropivacaine injected incrementally after negative test dose.  Patient asymptomatic, VSS, no heme aspirated, tolerated well.  Sandford Craze Kelvon Giannini, MD

## 2017-10-28 NOTE — Anesthesia Procedure Notes (Signed)
Date/Time: 10/28/2017 7:40 AM Performed by: Florene Routeeardon, Alayna Mabe L, CRNA Oxygen Delivery Method: Simple face mask

## 2017-10-28 NOTE — Anesthesia Postprocedure Evaluation (Signed)
Anesthesia Post Note  Patient: Kyle Fleming  Procedure(s) Performed: RIGHT TOTAL KNEE ARTHROPLASTY (Right )     Patient location during evaluation: PACU Anesthesia Type: Spinal Level of consciousness: awake and alert, patient cooperative and oriented Pain management: pain level controlled Vital Signs Assessment: post-procedure vital signs reviewed and stable Respiratory status: spontaneous breathing, nonlabored ventilation, patient connected to nasal cannula oxygen and respiratory function stable Cardiovascular status: blood pressure returned to baseline and stable Postop Assessment: no apparent nausea or vomiting, patient able to bend at knees and spinal receding Anesthetic complications: no    Last Vitals:  Vitals:   10/28/17 1230 10/28/17 1330  BP: 127/79 137/76  Pulse: 84 92  Resp: 16 16  Temp: 36.9 C 36.4 C  SpO2: 96% 96%    Last Pain:  Vitals:   10/28/17 1330  TempSrc: Oral  PainSc:                  [name],[name]

## 2017-10-29 LAB — CBC
HEMATOCRIT: 33.9 % — AB (ref 39.0–52.0)
HEMOGLOBIN: 11.4 g/dL — AB (ref 13.0–17.0)
MCH: 32.4 pg (ref 26.0–34.0)
MCHC: 33.6 g/dL (ref 30.0–36.0)
MCV: 96.3 fL (ref 78.0–100.0)
Platelets: 140 10*3/uL — ABNORMAL LOW (ref 150–400)
RBC: 3.52 MIL/uL — AB (ref 4.22–5.81)
RDW: 12.7 % (ref 11.5–15.5)
WBC: 6.1 10*3/uL (ref 4.0–10.5)

## 2017-10-29 LAB — BASIC METABOLIC PANEL
ANION GAP: 7 (ref 5–15)
BUN: 7 mg/dL (ref 6–20)
CHLORIDE: 103 mmol/L (ref 101–111)
CO2: 26 mmol/L (ref 22–32)
Calcium: 8.4 mg/dL — ABNORMAL LOW (ref 8.9–10.3)
Creatinine, Ser: 0.69 mg/dL (ref 0.61–1.24)
GFR calc non Af Amer: >60 mL/min (ref >60)
Glucose, Bld: 118 mg/dL — ABNORMAL HIGH (ref 65–99)
POTASSIUM: 3.1 mmol/L — AB (ref 3.5–5.1)
SODIUM: 136 mmol/L (ref 135–145)

## 2017-10-29 MED ORDER — ENALAPRIL MALEATE 10 MG PO TABS
10.0000 mg | ORAL_TABLET | Freq: Every day | ORAL | Status: DC
  Administered 2017-10-29 – 2017-10-31 (×3): 10 mg via ORAL
  Filled 2017-10-29 (×3): qty 1

## 2017-10-29 NOTE — Progress Notes (Signed)
OT Note:  Provided pt with AE kit and leg lifter.  [place], OTR/L [phone] 10/29/2017

## 2017-10-29 NOTE — Progress Notes (Signed)
   Subjective: 1 Day Post-Op Procedure(s) (LRB): RIGHT TOTAL KNEE ARTHROPLASTY (Right) Patient reports pain as  8.5 but batting his eyes due to somulence. Was on the phone as I entered room with nurse there.   Objective: Vital signs in last 24 hours: Temp:  [97.6 F (36.4 C)-98.5 F (36.9 C)] 98 F (36.7 C) (12/15 1032) Pulse Rate:  [76-92] 81 (12/15 1032) Resp:  [16-18] 18 (12/15 1032) BP: (127-162)/(75-100) 162/100 (12/15 1032) SpO2:  [94 %-98 %] 97 % (12/15 1032)  Intake/Output from previous day: 12/14 0701 - 12/15 0700 In: 5016.3 [P.O.:1320; I.V.:3381.3; IV Piggyback:315] Out: 4650 [Urine:4550; Blood:100] Intake/Output this shift: Total I/O In: -  Out: 200 [Urine:200]  Recent Labs    10/29/17 0447  HGB 11.4*   Recent Labs    10/29/17 0447  WBC 6.1  RBC 3.52*  HCT 33.9*  PLT 140*   Recent Labs    10/29/17 0447  NA 136  K 3.1*  CL 103  CO2 26  BUN 7  CREATININE 0.69  GLUCOSE 118*  CALCIUM 8.4*   No results for input(s): LABPT, INR in the last 72 hours.  Neurologically intact No results found.  Assessment/Plan: 1 Day Post-Op Procedure(s) (LRB): RIGHT TOTAL KNEE ARTHROPLASTY (Right) Up with therapy.     Eldred MangesMark C Dejanique Ruehl 10/29/2017, 12:24 PM

## 2017-10-29 NOTE — Progress Notes (Signed)
LCSW consulted for SNF placement  LCSW met with patient while he was working with PT. Patient reports he lives alone, but has a friend named Claiborne Billings whom may be able to assist him at home.  Patient is recommended for SNF, however he is unable to stay the required thirty days and give up his medicaid check for placement.  "I am too poor for that". He reports he went to [facility] in the past, however he did not have a benefit and was placed with a LOG.  Due to having the benefit at this time, he is unable to receive LOG support and required to use benefit which he is unable.  CM and PT aware that patient will DC home with home health. CM is working to get patient set up through [facility]. Patient will contact friend to see if she can help him at home as he transitions back to his house.  No other CSW needs at this time. Will sign off.  Lane Hacker, MSW Clinical Social Work: [employer] Coverage for :  [phone]

## 2017-10-29 NOTE — Progress Notes (Signed)
Physical Therapy Treatment Patient Details Name: Kyle ShinerBroaderick D Fleming MRN: [id number] DOB: 01/20/1956 Today's Date: 10/29/2017    History of Present Illness Pt s/p R TKR and with hx of bil THR    PT Comments    Pt making fair progress with mobility.  Pt considering return home vs SNF and checking with friends to determine level of assist available.  Will review in am.   Follow Up Recommendations  SNF     Equipment Recommendations  None recommended by PT    Recommendations for Other Services       Precautions / Restrictions Precautions Precautions: Fall;Knee Required Braces or Orthoses: Knee Immobilizer - Right Knee Immobilizer - Right: Discontinue once straight leg raise with < 10 degree lag Restrictions Weight Bearing Restrictions: No Other Position/Activity Restrictions: WBAT    Mobility  Bed Mobility Overal bed mobility: Needs Assistance Bed Mobility: Supine to Sit;Sit to Supine     Supine to sit: Min assist;Min guard Sit to supine: Min assist;Min guard   General bed mobility comments: cues for sequence and use of L LE to self assist  Transfers Overall transfer level: Needs assistance Equipment used: Rolling walker (2 wheeled) Transfers: Sit to/from Stand Sit to Stand: Min assist         General transfer comment: cues for LE management and use of UEs to self assist  Ambulation/Gait Ambulation/Gait assistance: Min assist Ambulation Distance (Feet): 80 Feet Assistive device: Rolling walker (2 wheeled) Gait Pattern/deviations: Step-to pattern;Decreased step length - right;Decreased step length - left;Shuffle;Trunk flexed Gait velocity: decr Gait velocity interpretation: Below normal speed for age/gender General Gait Details: cues for sequence, posture and position from [employer]W   Stairs            Wheelchair Mobility    Modified Rankin (Stroke Patients Only)       Balance                                            Cognition  Arousal/Alertness: Awake/alert Behavior During Therapy: WFL for tasks assessed/performed Overall Cognitive Status: Within Functional Limits for tasks assessed                                        Exercises Total Joint Exercises Ankle Circles/Pumps: AROM;Both;15 reps;Supine Quad Sets: AROM;Both;10 reps;Supine Heel Slides: AAROM;Right;15 reps;Supine Straight Leg Raises: AAROM;Right;15 reps;Supine Goniometric ROM: AAROM R knee -12 - 40    General Comments        Pertinent Vitals/Pain Pain Assessment: 0-10 Pain Score: 7  Pain Location: R knee Pain Descriptors / Indicators: Aching;Sore Pain Intervention(s): Limited activity within patient's tolerance;Monitored during session;Premedicated before session;Ice applied    Home Living                      Prior Function            PT Goals (current goals can now be found in the care plan section) Acute Rehab PT Goals Patient Stated Goal: Get this knee better so I can walk PT Goal Formulation: With patient Time For Goal Achievement: 11/04/17 Potential to Achieve Goals: Good Progress towards PT goals: Progressing toward goals    Frequency    7X/week      PT Plan Current plan remains appropriate  Co-evaluation              AM-PAC PT "6 Clicks" Daily Activity  Outcome Measure  Difficulty turning over in bed (including adjusting bedclothes, sheets and blankets)?: A Lot Difficulty moving from lying on back to sitting on the side of the bed? : A Lot Difficulty sitting down on and standing up from a chair with arms (e.g., wheelchair, bedside commode, etc,.)?: Unable Help needed moving to and from a bed to chair (including a wheelchair)?: A Lot Help needed walking in hospital room?: A Little Help needed climbing 3-5 steps with a railing? : A Lot 6 Click Score: 12    End of Session Equipment Utilized During Treatment: Gait belt Activity Tolerance: Patient tolerated treatment well;Patient  limited by pain Patient left: in bed;with call bell/phone within reach Nurse Communication: Mobility status PT Visit Diagnosis: Difficulty in walking, not elsewhere classified (R26.2) Pain - Right/Left: Right Pain - part of body: Knee     Time: 9629-52841305-1335 PT Time Calculation (min) (ACUTE ONLY): 30 min  Charges:  $Gait Training: 8-22 mins $Therapeutic Exercise: 8-22 mins                    G Codes:       Pg [phone]    Kyle Fleming 10/29/2017, 4:40 PM

## 2017-10-29 NOTE — Evaluation (Signed)
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  He lives alone and will benefit from continued Ot in both acute and rehab settings. Goals in acute are for supervision to min A levels    Follow Up Recommendations  SNF    Equipment Recommendations  3 in 1 bedside commode    Recommendations for Other Services       Precautions / Restrictions Precautions Precautions: Fall;Knee Required Braces or Orthoses: Knee Immobilizer - Right Knee Immobilizer - Right: Discontinue once straight leg raise with < 10 degree lag Restrictions Other Position/Activity Restrictions: WBAT      Mobility Bed Mobility               General bed mobility comments: oob  Transfers                      Balance                                           ADL either performed or assessed with clinical judgement   ADL Overall ADL's : Needs assistance/impaired Eating/Feeding: Independent   Grooming: Set up;Sitting   Upper Body Bathing: Set up;Sitting   Lower Body Bathing: Minimal assistance;Sit to/from stand   Upper Body Dressing : Set up;Sitting   Lower Body Dressing: Sit to/from stand;Moderate assistance                 General ADL Comments: limited by pain this session.  Pt used AE when he had THA.  Showed leg lifter but did not use due to increased pain. Will provide this for him, if he likes it. Will return after pain medications to try. Educated on precautions.  Pt was more comfortable with pillow under calf     Vision         Perception     Praxis      Pertinent Vitals/Pain Pain Score: 10-Worst pain ever Pain Location: R knee Pain Descriptors / Indicators: Aching;Sore Pain Intervention(s): Limited activity within  patient's tolerance;Monitored during session;Ice applied;Patient requesting pain meds-RN notified;Repositioned     Hand Dominance     Extremity/Trunk Assessment Upper Extremity Assessment Upper Extremity Assessment: Overall WFL for tasks assessed           Communication Communication Communication: No difficulties   Cognition Arousal/Alertness: Awake/alert Behavior During Therapy: WFL for tasks assessed/performed Overall Cognitive Status: Within Functional Limits for tasks assessed                                     General Comments       Exercises     Shoulder Instructions      Home Living Family/patient expects to be discharged to:: Skilled nursing facility Living Arrangements: Alone                                      Prior Functioning/Environment Level of Independence: Independent with assistive device(s)  OT Problem List: Decreased strength;Decreased activity tolerance;Decreased knowledge of use of DME or AE;Decreased knowledge of precautions;Pain      OT Treatment/Interventions: Self-care/ADL training;DME and/or AE instruction;Patient/family education    OT Goals(Current goals can be found in the care plan section) Acute Rehab OT Goals Patient Stated Goal: Rehab and the home and walking sans AD OT Goal Formulation: With patient Time For Goal Achievement: 11/05/17 Potential to Achieve Goals: Good ADL Goals Pt Will Transfer to Toilet: with supervision;ambulating;bedside commode Pt Will Perform Toileting - Clothing Manipulation and hygiene: sit to/from stand;with supervision Additional ADL Goal #1: pt will perform bed mobility and don KI using leg lifter at supervision level Additional ADL Goal #2: pt will completed LB dressing with AE with min A sit to stand  OT Frequency: Min 2X/week   Barriers to D/C:            Co-evaluation              AM-PAC PT "6 Clicks" Daily Activity     Outcome  Measure Help from another person eating meals?: None Help from another person taking care of personal grooming?: A Little Help from another person toileting, which includes using toliet, bedpan, or urinal?: A Little Help from another person bathing (including washing, rinsing, drying)?: A Little Help from another person to put on and taking off regular upper body clothing?: A Little Help from another person to put on and taking off regular lower body clothing?: A Lot 6 Click Score: 18   End of Session    Activity Tolerance: Patient limited by pain Patient left: in chair;with call bell/phone within reach  OT Visit Diagnosis: Pain Pain - Right/Left: Right Pain - part of body: Knee                Time: 5621-30860847-0855 OT Time Calculation (min): 8 min Charges:  OT General Charges $OT Visit: 1 Visit OT Evaluation $OT Eval Low Complexity: 1 Low G-Codes:     AtwoodMaryellen Zeric Fleming, OTR/L [phone] 10/29/2017  Kyle Fleming 10/29/2017, 10:04 AM

## 2017-10-30 NOTE — Progress Notes (Signed)
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   Follow Up Recommendations  Home health PT     Equipment Recommendations  3in1 (PT);Rolling walker with 5" wheels    Recommendations for Other Services OT consult     Precautions / Restrictions Precautions Precautions: Fall;Knee Required Braces or Orthoses: Knee Immobilizer - Right Knee Immobilizer - Right: Discontinue once straight leg raise with < 10 degree lag(Pt performed IND SLR this am) Restrictions Weight Bearing Restrictions: No Other Position/Activity Restrictions: WBAT    Mobility  Bed Mobility Overal bed mobility: Needs Assistance Bed Mobility: Supine to Sit     Supine to sit: Min guard;Supervision     General bed mobility comments: Pt self assisting R LE with leg lifter  Transfers Overall transfer level: Needs assistance Equipment used: Rolling walker (2 wheeled)   Sit to Stand: Min guard;Supervision         General transfer comment: cues for LE management and use of UEs to self assist  Ambulation/Gait Ambulation/Gait assistance: Min guard Ambulation Distance (Feet): 125 Feet Assistive device: Rolling walker (2 wheeled) Gait Pattern/deviations: Step-to pattern;Decreased step length - right;Decreased step length - left;Shuffle;Trunk flexed Gait velocity: decr Gait velocity interpretation: Below normal speed for age/gender General Gait Details: cues for sequence, posture and position from [employer]W   Stairs            Wheelchair Mobility    Modified Rankin (Stroke Patients Only)       Balance                                            Cognition Arousal/Alertness: Awake/alert Behavior During Therapy: WFL for tasks  assessed/performed Overall Cognitive Status: Within Functional Limits for tasks assessed                                        Exercises Total Joint Exercises Ankle Circles/Pumps: AROM;Both;15 reps;Supine Quad Sets: AROM;Both;Supine;15 reps Towel Squeeze: AROM;Both;10 reps;Supine Heel Slides: AAROM;Right;Supine;20 reps Hip ABduction/ADduction: AAROM;AROM;Right;10 reps;Supine    General Comments        Pertinent Vitals/Pain Pain Assessment: 0-10 Pain Score: 10-Worst pain ever Pain Location: R knee Pain Descriptors / Indicators: Aching;Sore Pain Intervention(s): Limited activity within patient's tolerance;Monitored during session;Premedicated before session;Ice applied    Home Living                 Additional Comments: tub at home.  standard commode    Prior Function            PT Goals (current goals can now be found in the care plan section) Acute Rehab PT Goals Patient Stated Goal: Get this knee better so I can walk PT Goal Formulation: With patient Time For Goal Achievement: 11/04/17 Potential to Achieve Goals: Good Progress towards PT goals: Progressing toward goals    Frequency    7X/week      PT Plan Discharge plan needs to be updated    Co-evaluation              AM-PAC PT "  6 Clicks" Daily Activity  Outcome Measure  Difficulty turning over in bed (including adjusting bedclothes, sheets and blankets)?: A Lot Difficulty moving from lying on back to sitting on the side of the bed? : A Lot Difficulty sitting down on and standing up from a chair with arms (e.g., wheelchair, bedside commode, etc,.)?: A Lot Help needed moving to and from a bed to chair (including a wheelchair)?: A Lot Help needed walking in hospital room?: A Little Help needed climbing 3-5 steps with a railing? : A Lot 6 Click Score: 13    End of Session Equipment Utilized During Treatment: Gait belt Activity Tolerance: Patient tolerated treatment  well;Patient limited by pain Patient left: in chair;with call bell/phone within reach Nurse Communication: Mobility status PT Visit Diagnosis: Difficulty in walking, not elsewhere classified (R26.2) Pain - Right/Left: Right Pain - part of body: Knee     Time: [id number] PT Time Calculation (min) (ACUTE ONLY): 20 min  Charges:  $Gait Training: 8-22 mins $Therapeutic Exercise: 23-37 mins                    G Codes:       Pg [phone]    Yocelin Vanlue 10/30/2017, 12:36 PM

## 2017-10-30 NOTE — Progress Notes (Signed)
   Subjective: 2 Days Post-Op Procedure(s) (LRB): RIGHT TOTAL KNEE ARTHROPLASTY (Right) Patient reports pain as moderate.    Objective: Vital signs in last 24 hours: Temp:  [97.5 F (36.4 C)-98 F (36.7 C)] 98 F (36.7 C) (12/16 0554) Pulse Rate:  [79-92] 88 (12/16 0554) Resp:  [16-18] 16 (12/16 0554) BP: (149-162)/(70-100) 160/88 (12/16 0554) SpO2:  [97 %-100 %] 99 % (12/16 0554)  Intake/Output from previous day: 12/15 0701 - 12/16 0700 In: 640 [P.O.:480; I.V.:160] Out: 1300 [Urine:1300] Intake/Output this shift: Total I/O In: -  Out: 550 [Urine:550]  Recent Labs    10/29/17 0447  HGB 11.4*   Recent Labs    10/29/17 0447  WBC 6.1  RBC 3.52*  HCT 33.9*  PLT 140*   Recent Labs    10/29/17 0447  NA 136  K 3.1*  CL 103  CO2 26  BUN 7  CREATININE 0.69  GLUCOSE 118*  CALCIUM 8.4*   No results for input(s): LABPT, INR in the last 72 hours.  Neurologically intact No results found.  Assessment/Plan: 2 Days Post-Op Procedure(s) (LRB): RIGHT TOTAL KNEE ARTHROPLASTY (Right) Up with therapy, Home Monday.   Kyle Fleming 10/30/2017, 7:58 AM

## 2017-10-30 NOTE — Progress Notes (Signed)
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    Follow Up Recommendations  (as much assistance as possible; plan is home)    Equipment Recommendations  3 in 1 bedside commode;Tub/shower bench(long bench: covered--medicaid)    Recommendations for Other Services      Precautions / Restrictions Precautions Precautions: Fall;Knee Required Braces or Orthoses: Knee Immobilizer - Right Knee Immobilizer - Right: Discontinue once straight leg raise with < 10 degree lag Restrictions.Marland KitchenDOING SLR PER PT Weight Bearing Restrictions: No Other Position/Activity Restrictions: WBAT       Mobility Bed Mobility               General bed mobility comments: oob  Transfers                      Balance                                           ADL either performed or assessed with clinical judgement   ADL                                         General ADL Comments: pt with increased pain.  His plan is now home.  Brought tub bench and demonstrated tub transfer using this.  Pt verbalized understanding but did not want to practice due to pain.  He will benefit from this and 3:1 at home.  Pt recalls using AE from hip sx:  kit and leg lifter were issued as he no longer has them.  Reviewed use. Pt will need assistance for ted hose and he is not sure if he will have any.  Recommended he leave teds on if it is a choice of wearing or doing without.  He can reach to straighten them; reminded him to make sure they are not wrinkled     Vision       Perception     Praxis      Cognition Arousal/Alertness: Awake/alert Behavior During Therapy: WFL for tasks assessed/performed Overall Cognitive Status: Within Functional Limits for tasks assessed                                          Exercises Exercises: Total Joint Total Joint Exercises Ankle Circles/Pumps: AROM;Both;15 reps;Supine Quad Sets: AROM;Both;Supine;15 reps Towel Squeeze: AROM;Both;10 reps;Supine Heel Slides: AAROM;Right;Supine;20 reps Hip ABduction/ADduction: AAROM;AROM;Right;10 reps;Supine   Shoulder Instructions       General Comments      Pertinent Vitals/ Pain       Pain Assessment: 0-10 Pain Score: 10-Worst pain ever Pain Location: R knee Pain Descriptors / Indicators: Aching;Sore Pain Intervention(s): Limited activity within patient's tolerance;Monitored during session;Premedicated before session;Repositioned  Home Living                                   Additional Comments: tub at home.  standard commode  Prior Functioning/Environment              Frequency  Min 2X/week        Progress Toward Goals  OT Goals(current goals can now be found in the care plan section)  Progress towards OT goals: Progressing toward goals  Acute Rehab OT Goals Patient Stated Goal: Get this knee better so I can walk  Plan      Co-evaluation                 AM-PAC PT "6 Clicks" Daily Activity     Outcome Measure   Help from another person eating meals?: None Help from another person taking care of personal grooming?: A Little Help from another person toileting, which includes using toliet, bedpan, or urinal?: A Little Help from another person bathing (including washing, rinsing, drying)?: A Little Help from another person to put on and taking off regular upper body clothing?: A Little Help from another person to put on and taking off regular lower body clothing?: A Lot 6 Click Score: 18    End of Session    OT Visit Diagnosis: Pain Pain - Right/Left: Right Pain - part of body: Knee   Activity Tolerance Patient limited by pain   Patient Left in chair;with call bell/phone within reach   Nurse Communication           Time: 1610-9604 OT Time Calculation (min): 22 min  Charges: OT General Charges $OT Visit: 1 Visit OT Treatments $Self Care/Home Management : 8-22 mins  Lesle Chris, OTR/L [phone] 10/30/2017   Kyle Fleming 10/30/2017, 12:27 PM

## 2017-10-30 NOTE — Progress Notes (Signed)
Patient pulled out IV around change of shift because he no longer wanted it in his arm.  He does not want another IV placed. He is not receiving IVF's or IV medications. Priscille KluverLineback, Fenix Rorke M

## 2017-10-30 NOTE — Progress Notes (Signed)
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     Follow Up Recommendations  Home health PT     Equipment Recommendations  3in1 (PT);Rolling walker with 5" wheels    Recommendations for Other Services OT consult     Precautions / Restrictions Precautions Precautions: Fall;Knee Required Braces or Orthoses: Knee Immobilizer - Right Knee Immobilizer - Right: Discontinue once straight leg raise with < 10 degree lag(performed IND SLR this date) Restrictions Weight Bearing Restrictions: No Other Position/Activity Restrictions: WBAT    Mobility  Bed Mobility Overal bed mobility: Needs Assistance Bed Mobility: Supine to Sit;Sit to Supine     Supine to sit: Supervision Sit to supine: Min guard;Supervision   General bed mobility comments: Pt self assisting R LE with leg lifter  Transfers Overall transfer level: Needs assistance Equipment used: Rolling walker (2 wheeled) Transfers: Sit to/from Stand Sit to Stand: Supervision         General transfer comment: min cues for LE management and use of UEs to self assist  Ambulation/Gait Ambulation/Gait assistance: Min guard Ambulation Distance (Feet): 130 Feet Assistive device: Rolling walker (2 wheeled) Gait Pattern/deviations: Step-to pattern;Decreased step length - right;Decreased step length - left;Shuffle;Trunk flexed Gait velocity: decr Gait velocity interpretation: Below normal speed for age/gender General Gait Details: cues for posture and position from RW   Stairs            Wheelchair Mobility    Modified Rankin (Stroke Patients Only)       Balance                                            Cognition Arousal/Alertness: Awake/alert Behavior  During Therapy: WFL for tasks assessed/performed Overall Cognitive Status: Within Functional Limits for tasks assessed                                        Exercises Total Joint Exercises Ankle Circles/Pumps: AROM;Both;15 reps;Supine Long Arc Quad: AROM;Right;10 reps;Seated Knee Flexion: AROM;AAROM;Right;20 reps;Seated    General Comments        Pertinent Vitals/Pain Pain Assessment: 0-10 Pain Score: 8  Pain Location: R knee Pain Descriptors / Indicators: Aching;Sore Pain Intervention(s): Limited activity within patient's tolerance;Monitored during session;Premedicated before session;Ice applied    Home Living                      Prior Function            PT Goals (current goals can now be found in the care plan section) Acute Rehab PT Goals Patient Stated Goal: Get this knee better so I can walk PT Goal Formulation: With patient Time For Goal Achievement: 11/04/17 Potential to Achieve Goals: Good Progress towards PT goals: Progressing toward goals    Frequency    7X/week      PT Plan Current plan remains appropriate    Co-evaluation              AM-PAC PT "6 Clicks" Daily Activity  Outcome Measure  Difficulty turning over  in bed (including adjusting bedclothes, sheets and blankets)?: A Lot Difficulty moving from lying on back to sitting on the side of the bed? : A Lot Difficulty sitting down on and standing up from a chair with arms (e.g., wheelchair, bedside commode, etc,.)?: A Lot Help needed moving to and from a bed to chair (including a wheelchair)?: A Little Help needed walking in hospital room?: A Little Help needed climbing 3-5 steps with a railing? : A Lot 6 Click Score: 14    End of Session Equipment Utilized During Treatment: Gait belt Activity Tolerance: Patient tolerated treatment well;Patient limited by pain Patient left: in bed;with call bell/phone within reach Nurse Communication: Mobility status PT  Visit Diagnosis: Difficulty in walking, not elsewhere classified (R26.2) Pain - Right/Left: Right Pain - part of body: Knee     Time: 1455-1530 PT Time Calculation (min) (ACUTE ONLY): 35 min  Charges:  $Gait Training: 8-22 mins $Therapeutic Exercise: 8-22 mins                    G Codes:       Pg [phone]    Tharon Bomar 10/30/2017, 4:27 PM

## 2017-10-30 NOTE — Progress Notes (Signed)
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  Pt performed therex program. OOB deferred to allow recovery.  Cold packs applied   Follow Up Recommendations  SNF     Equipment Recommendations  None recommended by PT    Recommendations for Other Services       Precautions / Restrictions Precautions Precautions: Fall;Knee Required Braces or Orthoses: Knee Immobilizer - Right Knee Immobilizer - Right: Discontinue once straight leg raise with < 10 degree lag Restrictions Weight Bearing Restrictions: No Other Position/Activity Restrictions: WBAT    Mobility  Bed Mobility               General bed mobility comments: OOB deferred to later at pt request following therex  Transfers                    Ambulation/Gait                 Stairs            Wheelchair Mobility    Modified Rankin (Stroke Patients Only)       Balance                                            Cognition Arousal/Alertness: Awake/alert Behavior During Therapy: WFL for tasks assessed/performed Overall Cognitive Status: Within Functional Limits for tasks assessed                                        Exercises Total Joint Exercises Ankle Circles/Pumps: AROM;Both;15 reps;Supine Quad Sets: AROM;Both;Supine;15 reps Towel Squeeze: AROM;Both;10 reps;Supine Heel Slides: AAROM;Right;Supine;20 reps Hip ABduction/ADduction: AAROM;AROM;Right;10 reps;Supine    General Comments        Pertinent Vitals/Pain Pain Assessment: 0-10 Pain Score: 8  Pain Location: R knee Pain Descriptors / Indicators: Aching;Sore Pain Intervention(s): Limited activity within patient's tolerance;Monitored during session;Premedicated before session;Ice applied    Home Living                       Prior Function            PT Goals (current goals can now be found in the care plan section) Acute Rehab PT Goals Patient Stated Goal: Get this knee better so I can walk PT Goal Formulation: With patient Time For Goal Achievement: 11/04/17 Potential to Achieve Goals: Good Progress towards PT goals: Progressing toward goals    Frequency    7X/week      PT Plan Current plan remains appropriate    Co-evaluation              AM-PAC PT "6 Clicks" Daily Activity  Outcome Measure  Difficulty turning over in bed (including adjusting bedclothes, sheets and blankets)?: A Lot Difficulty moving from lying on back to sitting on the side of the bed? : A Lot Difficulty sitting down on and standing up from a chair with arms (e.g., wheelchair, bedside commode, etc,.)?: A Lot Help needed moving to and from a bed to chair (including a wheelchair)?: A Lot Help needed walking in hospital room?: A  Little Help needed climbing 3-5 steps with a railing? : A Lot 6 Click Score: 13    End of Session Equipment Utilized During Treatment: Gait belt Activity Tolerance: Patient tolerated treatment well;Patient limited by pain Patient left: in bed;with call bell/phone within reach   PT Visit Diagnosis: Difficulty in walking, not elsewhere classified (R26.2) Pain - Right/Left: Right Pain - part of body: Knee     Time: [id number] PT Time Calculation (min) (ACUTE ONLY): 27 min  Charges:  $Therapeutic Exercise: 23-37 mins                    G Codes:      Pg [phone]    Jules Baty 10/30/2017, 9:28 AM

## 2017-10-31 MED ORDER — OXYCODONE-ACETAMINOPHEN 5-325 MG PO TABS: 1.0000 | ORAL_TABLET | ORAL | 0 refills | Status: DC | PRN

## 2017-10-31 MED ORDER — CYCLOBENZAPRINE HCL 10 MG PO TABS: 10.0000 mg | ORAL_TABLET | Freq: Three times a day (TID) | ORAL | 2 refills | Status: DC | PRN

## 2017-10-31 MED ORDER — CYCLOBENZAPRINE HCL 10 MG PO TABS: 10.0000 mg | ORAL_TABLET | Freq: Three times a day (TID) | ORAL | 1 refills | Status: DC | PRN

## 2017-10-31 NOTE — Discharge Summary (Signed)
Patient ID: Kyle Fleming Joung MRN: [id number] DOB/AGE: 1956-08-20 61 y.o.  Admit date: 10/28/2017 Discharge date: 10/31/2017  Admission Diagnoses:  Principal Problem:   Status post total right knee replacement   Discharge Diagnoses:  Same  Past Medical History:  Diagnosis Date  . Arthritis   . Hypertension   . Phlebitis    hx of in right leg - 2016     Surgeries: Procedure(s): RIGHT TOTAL KNEE ARTHROPLASTY on 10/28/2017   Consultants:   Discharged Condition: Improved  Hospital Course: Kyle Fleming Brunkow is an 61 y.o. male who was admitted 10/28/2017 for operative treatment ofStatus post total right knee replacement. Patient has severe unremitting pain that affects sleep, daily activities, and work/hobbies. After pre-op clearance the patient was taken to the operating room on 10/28/2017 and underwent  Procedure(s): RIGHT TOTAL KNEE ARTHROPLASTY.    Patient was given perioperative antibiotics:  Anti-infectives (From admission, onward)   Start     Dose/Rate Route Frequency Ordered Stop   10/28/17 1400  clindamycin (CLEOCIN) IVPB 600 mg     600 mg 100 mL/hr over 30 Minutes Intravenous Every 6 hours 10/28/17 1128 10/28/17 2024   10/28/17 0530  clindamycin (CLEOCIN) 900 MG/50ML IVPB    Comments:  Curlene DolphinWhitlow, Cheryl   : cabinet override      10/28/17 0530 10/28/17 0751   10/28/17 0523  clindamycin (CLEOCIN) IVPB 900 mg     900 mg 100 mL/hr over 30 Minutes Intravenous On call to O.R. 10/28/17 [id number] 10/28/17 0806       Patient was given sequential compression devices, early ambulation, and chemoprophylaxis to prevent DVT.  Patient benefited maximally from hospital stay and there were no complications.    Recent vital signs:  Patient Vitals for the past 24 hrs:  BP Temp Temp src Pulse Resp SpO2  10/30/17 2245 139/68 98.2 F (36.8 C) Oral 73 16 96 %  10/30/17 1438 131/69 97.8 F (36.6 C) Oral 67 16 98 %     Recent laboratory studies:  Recent Labs    10/29/17 0447   WBC 6.1  HGB 11.4*  HCT 33.9*  PLT 140*  NA 136  K 3.1*  CL 103  CO2 26  BUN 7  CREATININE 0.69  GLUCOSE 118*  CALCIUM 8.4*     Discharge Medications:   Allergies as of 10/31/2017      Reactions   Amitriptyline Other (See Comments)   Confusion, out of this world feeling   Naproxen Other (See Comments)   Doesn't work for patient   Penicillins Rash   Has patient had a PCN reaction causing immediate rash, facial/tongue/throat swelling, SOB or lightheadedness with hypotension: no Has patient had a PCN reaction causing severe rash involving mucus membranes or skin necrosis: no Has patient had a PCN reaction that required hospitalization no Has patient had a PCN reaction occurring within the last 10 years: no If all of the above answers are "NO", then may proceed with Cephalosporin use.      Medication List    TAKE these medications   ABSORBASE Oint Apply 1 application topically daily.   aspirin 325 MG EC tablet Take 1 tablet (325 mg total) by mouth daily with breakfast.   BC HEADACHE POWDER PO Take 1 packet by mouth 2 (two) times daily as needed (pain).   clopidogrel 75 MG tablet Commonly known as:  PLAVIX Take 75 mg by mouth daily.   cyclobenzaprine 10 MG tablet Commonly known as:  FLEXERIL Take 1 tablet (10 mg  total) by mouth 3 (three) times daily as needed for muscle spasms. What changed:    medication strength  how much to take   enalapril 10 MG tablet Commonly known as:  VASOTEC Take 10 mg by mouth daily.   hydrochlorothiazide 25 MG tablet Commonly known as:  HYDRODIURIL Take 25 mg by mouth daily.   lovastatin 20 MG tablet Commonly known as:  MEVACOR Take 20 mg by mouth daily at 6 PM.   multivitamin with minerals Tabs tablet Take 1 tablet by mouth daily.   oxyCODONE-acetaminophen 5-325 MG tablet Commonly known as:  ROXICET Take 1-2 tablets by mouth every 4 (four) hours as needed.   Vitamin Fleming (Ergocalciferol) 50000 units Caps  capsule Commonly known as:  DRISDOL Take 50,000 Units by mouth once a week. On Mondays            Durable Medical Equipment  (From admission, onward)        Start     Ordered   10/30/17 1215  For home use only DME Tub bench  Once     10/30/17 1214   10/28/17 1129  DME Walker rolling  Once    Question:  Patient needs a walker to treat with the following condition  Answer:  Status post total right knee replacement   10/28/17 1128   10/28/17 1129  DME 3 n 1  Once     10/28/17 1128      Diagnostic Studies: Dg Knee Right Port  Result Date: 10/28/2017 CLINICAL DATA:  Status post right total knee arthroplasty EXAM: PORTABLE RIGHT KNEE - 1-2 VIEW COMPARISON:  09/17/2016 right knee radiographs FINDINGS: Interval right total knee arthroplasty, with well-positioned right distal femoral and right proximal tibial prostheses. No dislocation. No osseous fracture. No suspicious focal osseous lesions. Expected gas within and surrounding the right knee joint anteriorly. Midline skin staples overlie the right knee anteriorly. IMPRESSION: Satisfactory immediate postoperative appearance status post right total knee arthroplasty. Electronically Signed   By: Delbert PhenixJason A Poff M.Fleming.   On: 10/28/2017 10:25    Disposition: to home  Discharge Instructions    Discharge patient   Complete by:  As directed    Discharge disposition:  01-Home or Self Care   Discharge patient date:  10/31/2017      Follow-up Information    [facility] Follow up.   Specialty:  Home Health Services Why:  Home Health Physical Therapy- agency will call with initial visit Contact information: [address] [place] [address] [phone]        Kathryne HitchBlackman, Christopher Y, MD Follow up in 2 week(s).   Specialty:  Orthopedic Surgery Contact information: [address] [place] [address] [phone]            Signed: Kathryne HitchChristopher Y Blackman 10/31/2017, 7:13 AM

## 2017-10-31 NOTE — Progress Notes (Signed)
Patient ID: Kyle Fleming, male   DOB: 17-Apr-1956, 61 y.o.   MRN: [id number] Doing well overall.  Right knee stable.  Can be discharged to home today.

## 2017-10-31 NOTE — Op Note (Signed)
NAME:  Kyle Fleming, Kyle Fleming                 ACCOUNT NO.:  MEDICAL RECORD NO.:  [id number]  LOCATION:                                 FACILITY:  PHYSICIAN:  Vanita PandaChristopher Y. 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  POSTOPERATIVE DIAGNOSIS:  Severe osteoarthritis and degenerative joint disease with valgus malalignment, right knee.  PROCEDURE:  Right total knee arthroplasty.  IMPLANTS:  Stryker Triathlon knee with size 4 cemented femur, size 4 universal cemented tibial base plate, 9 mm fixed-bearing polyethylene insert, size 32 cemented patellar button.  SURGEON:  Vanita PandaChristopher Y. Magnus IvanBlackman, M.D.  ASSISTANT:  Richardean CanalGilbert Clark, PA-C.  ANESTHESIA: 1. Right lower extremity adductor canal block. 2. Spinal.  ANTIBIOTICS:  IV clindamycin 900 mg.  ESTIMATED BLOOD LOSS:  Less than 200 mL.  TOURNIQUET TIME:  Less than 1 hour.  COMPLICATIONS:  None.  INDICATIONS:  [name] is a 61 year old gentleman, well known to me. He has severe debilitating arthritis of his right knee with significant periarticular osteophytes and complete loss of the joint space.  He has valgus malalignment.  He has tried and failed all forms of conservative treatment for over a year now and does wish to proceed with a total knee arthroplasty.  He understands the risk of acute blood loss anemia, nerve and vessel injury, fracture, infection, and DVT.  He understands our goals are to decrease pain, improve mobility, and overall improve quality of life.  PROCEDURE DESCRIPTION:  After informed consent was obtained, appropriate right knee was marked.  Anesthesia obtained, an adductor canal block in the holding area.  He was brought to the operating room, and sat up on the operating table, where spinal  anesthesia was obtained.  He was then laid in a supine position.  A nonsterile tourniquet was placed around his upper right thigh.  His right thigh, knee, ankle, and leg were prepped and draped with DuraPrep and sterile drapes.  Sterile stockinette was utilized as well.  With the time-out called to identify the correct patient and correct right knee, we then used Esmarch to wrap out the leg and tourniquet was inflated to 300 mm of pressure.  We then made a direct midline incision over the patella and carried this proximally and distally.  We dissected down the knee joint and carried out a medial parapatellar arthrotomy finding a very large joint effusion and finding the knee devoid of cartilage.  We removed remnants of ACL, PCL, medial and lateral meniscus.  With the knee in a flexed position, we set our extramedullary tibial cutting guide for correcting for varus and valgus and neutral slope, and taking 9 mm off the high side.  We did this without difficulty.  We then used an intramedullary guide through the notch of the femur setting this at 5 degrees externally rotated for right knee, setting our distal femoral cut at 10.  We made this cut too without difficulty.  We then brought the knee back down to full extension, and with a 9 mm  extension block, achieved full extension.  We then went back to the femur and put our femoral sizing guide based off the epicondylar axis for 3 degrees right and chose a size 4 femur.  We put our 4-in-1 cutting block for a size 4 femur, made our anterior and posterior cuts followed by our chamfer cuts.  We then made our femoral box cut.  We then went back down the tibia and trialed a size 4 for tibial coverage of the tibia.  We did our keel cut and then a drill hole for universal base plate, given his bone quality.  With the trial size 4 tibia followed by the 4 femur, we placed a 9 mm trial polyethylene insert and we were pleased with range of motion and  stability.  We then cut our patella and drilled 3 holes for a size 32 patellar button.  We then removed all trial instrumentation, irrigated the knee with normal saline solution.  We mixed our cement and then cemented the real Stryker Triathlon universal base plate for right knee size 4 followed by the real size 4 right femur.  We placed a real 9 mm fixed-bearing polyethylene insert and cemented our patellar button.  Once the cement hardened, we let the tourniquet down.  Hemostasis obtained with electrocautery.  We irrigated the knee again with normal saline solution and closed the arthrotomy with interrupted #1 Vicryl suture followed by 0 Vicryl in the deep tissue, 2-0 Vicryl in the subcutaneous tissue, interrupted staples on the skin.  Well-padded sterile dressing was applied.  He was taken to the recovery room in stable condition.  All final counts were correct.  There were no complications noted.     Vanita Pandahristopher Y. Magnus IvanBlackman, M.D.     CYB/MEDQ  D:  10/28/2017  T:  10/29/2017  Job:  [id number]

## 2017-10-31 NOTE — Discharge Instructions (Signed)

## 2017-10-31 NOTE — Progress Notes (Addendum)
Discharge planning, spoke with patient at bedside. Have chosen [facility] for HH PT. Contacted [facility] for referral. Needs RW and 3n1, contacted [facility] to deliver to room. [phone] 

## 2017-10-31 NOTE — Progress Notes (Signed)
Physical Therapy Treatment Patient Details Name: Kyle Fleming MRN: [id number] DOB: 03/05/1956 Today's Date: 10/31/2017    History of Present Illness Pt s/p R TKR and with hx of bil THR    PT Comments    POD # 3 am session Assisted OOB to amb a greater distance, no stairs then returned to room to perform TKR TE's following HEP handout.    Follow Up Recommendations  Home health PT     Equipment Recommendations  3in1 (PT);Rolling walker with 5" wheels    Recommendations for Other Services       Precautions / Restrictions Precautions Precautions: Fall Restrictions Weight Bearing Restrictions: No Other Position/Activity Restrictions: WBAT    Mobility  Bed Mobility Overal bed mobility: Needs Assistance Bed Mobility: Supine to Sit     Supine to sit: Supervision     General bed mobility comments: Pt self assisting R LE with leg lifter  Transfers Overall transfer level: Needs assistance Equipment used: Rolling walker (2 wheeled) Transfers: Sit to/from Stand Sit to Stand: Supervision         General transfer comment: min cues for LE management and use of UEs to self assist  Ambulation/Gait Ambulation/Gait assistance: Min guard Ambulation Distance (Feet): 155 Feet Assistive device: Rolling walker (2 wheeled) Gait Pattern/deviations: Step-to pattern;Decreased step length - right;Decreased step length - left;Shuffle;Trunk flexed Gait velocity: decr   General Gait Details: cues for posture and position from RW safety with turns   Stairs Stairs: (no stairs)          Wheelchair Mobility    Modified Rankin (Stroke Patients Only)       Balance                                            Cognition Arousal/Alertness: Awake/alert Behavior During Therapy: WFL for tasks assessed/performed Overall Cognitive Status: Within Functional Limits for tasks assessed                                        Exercises    Total Knee Replacement TE's 10 reps B LE ankle pumps 10 reps towel squeezes 10 reps knee presses 10 reps heel slides  10 reps SAQ's 10 reps SLR's 10 reps ABD Followed by ICE     General Comments        Pertinent Vitals/Pain Pain Assessment: 0-10 Pain Score: 4  Pain Location: R knee Pain Descriptors / Indicators: Aching;Sore;Grimacing;Operative site guarding Pain Intervention(s): Monitored during session;Repositioned;Premedicated before session;Ice applied    Home Living                      Prior Function            PT Goals (current goals can now be found in the care plan section) Progress towards PT goals: Progressing toward goals    Frequency    7X/week      PT Plan Current plan remains appropriate    Co-evaluation              AM-PAC PT "6 Clicks" Daily Activity  Outcome Measure  Difficulty turning over in bed (including adjusting bedclothes, sheets and blankets)?: A Lot Difficulty moving from lying on back to sitting on the side of the bed? : A Lot Difficulty sitting  down on and standing up from a chair with arms (e.g., wheelchair, bedside commode, etc,.)?: A Lot Help needed moving to and from a bed to chair (including a wheelchair)?: A Little Help needed walking in hospital room?: A Little Help needed climbing 3-5 steps with a railing? : A Lot 6 Click Score: 14    End of Session Equipment Utilized During Treatment: Gait belt Activity Tolerance: Patient tolerated treatment well;Patient limited by pain Patient left: in bed;with call bell/phone within reach Nurse Communication: Mobility status PT Visit Diagnosis: Difficulty in walking, not elsewhere classified (R26.2) Pain - Right/Left: Right Pain - part of body: Knee     Time: 1005-1030 PT Time Calculation (min) (ACUTE ONLY): 25 min  Charges:  $Gait Training: 8-22 mins $Therapeutic Exercise: 8-22 mins                    G Codes:       Felecia ShellingLori Jovante Hammitt  PTA [facility]  Acute   Rehab Pager      [phone]

## 2017-10-31 NOTE — Progress Notes (Signed)
Nurse and Nurse tech transported patient to vehicle of person taking patient home via wheelchair. Patient in stable condition. Rolling walker, 3 in 1 loaded into car with patients belongings.

## 2017-11-02 ENCOUNTER — Other Ambulatory Visit (INDEPENDENT_AMBULATORY_CARE_PROVIDER_SITE_OTHER): Payer: Self-pay | Admitting: Orthopaedic Surgery

## 2017-11-02 ENCOUNTER — Telehealth (INDEPENDENT_AMBULATORY_CARE_PROVIDER_SITE_OTHER): Payer: Self-pay

## 2017-11-02 NOTE — Telephone Encounter (Signed)
Kisa Nurse from [facility] called left voicemail stating that she received Referral for patient and will be able to go until 11/04/17. Would like a call back [phone]

## 2017-11-02 NOTE — Telephone Encounter (Signed)
That is the strongest.  Nothing else can be prescribed

## 2017-11-02 NOTE — Telephone Encounter (Signed)
Advise

## 2017-11-02 NOTE — Telephone Encounter (Signed)
Patient called advised the Oxycodone is not working. Patient asked if there is anything else Dr Magnus IvanBlackman can prescribe for him? The number to contact patient is [phone]

## 2017-11-03 NOTE — Telephone Encounter (Signed)
FYI

## 2017-11-03 NOTE — Telephone Encounter (Signed)
Patient aware of the below message  

## 2017-11-04 ENCOUNTER — Telehealth (INDEPENDENT_AMBULATORY_CARE_PROVIDER_SITE_OTHER): Payer: Self-pay | Admitting: Radiology

## 2017-11-04 NOTE — Telephone Encounter (Signed)
FYI----Kecia from [facility] is calling to advise that patient has a start date for PT on 11/05/2017.

## 2017-11-09 ENCOUNTER — Telehealth (INDEPENDENT_AMBULATORY_CARE_PROVIDER_SITE_OTHER): Payer: Self-pay | Admitting: Orthopaedic Surgery

## 2017-11-09 NOTE — Telephone Encounter (Signed)
Ok for verbal order for that. Thanks

## 2017-11-09 NOTE — Telephone Encounter (Signed)
I called and advised. 

## 2017-11-09 NOTE — Telephone Encounter (Signed)
Ok for verbal 

## 2017-11-09 NOTE — Telephone Encounter (Signed)
[facility] verbal order for Physical Therapy :1time a for 1 week , 3 times a week for 2 weeks. Evaluation was on 11/05/17 Schrese call back # [phone]

## 2017-11-14 ENCOUNTER — Ambulatory Visit (INDEPENDENT_AMBULATORY_CARE_PROVIDER_SITE_OTHER): Payer: Medicaid Other | Admitting: Physician Assistant

## 2017-11-14 ENCOUNTER — Encounter (INDEPENDENT_AMBULATORY_CARE_PROVIDER_SITE_OTHER): Payer: Self-pay | Admitting: Physician Assistant

## 2017-11-14 DIAGNOSIS — G8929 Other chronic pain: Secondary | ICD-10-CM

## 2017-11-14 DIAGNOSIS — M25561 Pain in right knee: Secondary | ICD-10-CM

## 2017-11-14 DIAGNOSIS — Z96651 Presence of right artificial knee joint: Secondary | ICD-10-CM

## 2017-11-14 MED ORDER — OXYCODONE-ACETAMINOPHEN 5-325 MG PO TABS: 1.0000 | ORAL_TABLET | ORAL | 0 refills | Status: DC | PRN

## 2017-11-14 MED ORDER — CYCLOBENZAPRINE HCL 10 MG PO TABS: 10.0000 mg | ORAL_TABLET | Freq: Three times a day (TID) | ORAL | 1 refills | Status: DC | PRN

## 2017-11-14 NOTE — Progress Notes (Signed)
[name] returns 2 weeks status post right total knee arthroplasty.  He states home therapy is just been approved.  Is been doing exercises on his own.  He is on Plavix chronically.  Said no chest pain shortness breath fevers chills.  Right knee: Surgical incisions well approximated with staples.  No signs of infection or dehiscence.  Right calf supple nontender lacks approximately 5 degrees in full extension and flexes to 85 degrees.  Able to perform a straight leg raise  Impression:  Status post right total knee arthroplasty 10/08/2016  Plan: Staples removed Steri-Strips applied.  He will work on range of motion strengthening of the right knee.  He is to discontinue the knee immobilizer.  He will return in 1 month sooner if he has any questions concerns or signs of infection.  We will send him to outpatient physical therapy for strengthening and range of motion of the right knee.

## 2017-11-21 ENCOUNTER — Ambulatory Visit: Payer: Medicaid Other | Attending: Orthopaedic Surgery

## 2017-11-21 DIAGNOSIS — Z96653 Presence of artificial knee joint, bilateral: Secondary | ICD-10-CM | POA: Insufficient documentation

## 2017-11-21 DIAGNOSIS — G8929 Other chronic pain: Secondary | ICD-10-CM | POA: Insufficient documentation

## 2017-11-21 DIAGNOSIS — R262 Difficulty in walking, not elsewhere classified: Secondary | ICD-10-CM | POA: Diagnosis not present

## 2017-11-21 DIAGNOSIS — M25661 Stiffness of right knee, not elsewhere classified: Secondary | ICD-10-CM | POA: Diagnosis not present

## 2017-11-21 DIAGNOSIS — M6281 Muscle weakness (generalized): Secondary | ICD-10-CM | POA: Insufficient documentation

## 2017-11-21 DIAGNOSIS — Z96643 Presence of artificial hip joint, bilateral: Secondary | ICD-10-CM | POA: Diagnosis not present

## 2017-11-21 DIAGNOSIS — M25561 Pain in right knee: Secondary | ICD-10-CM | POA: Insufficient documentation

## 2017-11-21 NOTE — Therapy (Signed)
[facility] [address] [place], [place], [address] Phone: [phone]   Fax:  [phone]  Physical Therapy Evaluation  Patient Details  Name: Kyle Fleming MRN: [id number] Date of Birth: 03/22/1956 Referring Provider: Doneen Poissonhristopher Blackman, MD   Encounter Date: 11/21/2017  PT End of Session - 11/21/17 1121    Visit Number  1    Number of Visits  16    Date for PT Re-Evaluation  01/27/18    Authorization Type  Medicaid    PT Start Time  1015    PT Stop Time  1105    PT Time Calculation (min)  50 min    Activity Tolerance  Patient tolerated treatment well;No increased pain    Behavior During Therapy  WFL for tasks assessed/performed       Past Medical History:  Diagnosis Date  . Arthritis   . Hypertension   . Phlebitis    hx of in right leg - 2016     Past Surgical History:  Procedure Laterality Date  . JOINT REPLACEMENT  2003   right hip   . stents [facility] in right leg      [facility]   . TOTAL HIP ARTHROPLASTY Left 10/08/2016   Procedure: LEFT TOTAL HIP ARTHROPLASTY ANTERIOR APPROACH;  Surgeon: Kyle Hitchhristopher Y Blackman, MD;  Location: [facility];  Service: Orthopedics;  Laterality: Left;  . TOTAL KNEE ARTHROPLASTY Right 10/28/2017   Procedure: RIGHT TOTAL KNEE ARTHROPLASTY;  Surgeon: Kyle HitchBlackman, Kyle Y, MD;  Location: [facility];  Service: Orthopedics;  Laterality: Right;    There were no vitals filed for this visit.   Subjective Assessment - 11/21/17 1030    Subjective  He reports TKA RT ,   He has used WC for mobility for past couple of years  due to OA.  He feels over patella numb.  Reports popping sounds       Pertinent History  2017 LT THA  2003 RT hip THA    Limitations  Walking    How long can you sit comfortably?  As needed 60 min    How long can you stand comfortably?  1-2 min    How long can you walk comfortably?  Not sure    Patient Stated Goals  Wants to walk again.       Currently in Pain?  Yes     Pain Score  9     Pain Location  Knee    Pain Orientation  Right    Pain Descriptors / Indicators  Burning;Stabbing    Pain Type  Chronic pain    Pain Onset  More than a month ago    Pain Frequency  Constant    Aggravating Factors   Walking/ standing/ stand from sitting,      Pain Relieving Factors  Rest, continue activity         [facility] - 11/21/17 0001      Assessment   Medical Diagnosis  RT TKA    Referring Provider  Kyle Poissonhristopher Blackman, MD    Onset Date/Surgical Date  -- 10/28/17    Next MD Visit  -- 12/15/2017    Prior Therapy  yes      Precautions   Precautions  None      Restrictions   Weight Bearing Restrictions  No      Balance Screen   Has the patient fallen in the past 6 months  No      Prior  Function   Level of Independence  Requires assistive device for independence      Cognition   Overall Cognitive Status  Within Functional Limits for tasks assessed      Posture/Postural Control   Posture Comments  flesed RT knee in sitting      ROM / Strength   AROM / PROM / Strength  AROM;PROM;Strength      AROM   AROM Assessment Site  Knee    Right/Left Knee  Right;Left    Right Knee Extension  -32    Right Knee Flexion  75    Left Knee Extension  -5    Left Knee Flexion  123      PROM   Overall PROM Comments  hip ext LT 15 degrees  RT -5    PROM Assessment Site  Knee    Right/Left Knee  Right    Right Knee Extension  -28    Right Knee Flexion  80      Strength   Overall Strength Comments  hip abduction and extension 4-/5    Strength Assessment Site  Knee    Right/Left Knee  Right;Left    Right Knee Flexion  5/5    Right Knee Extension  5/5    Left Knee Flexion  5/5    Left Knee Extension  5/5      Palpation   Patella mobility  pain with patellar mobility/ stiff in all directions in all directions      Ambulation/Gait   Ambulation/Gait  Yes    Ambulation/Gait Assistance  6: Modified independent (Device/Increase time)     Ambulation Distance (Feet)  300 Feet    Assistive device  None    Gait Pattern  Step-through pattern;Decreased hip/knee flexion - right dcr ext RT hip and knee    Stairs  Yes    Stairs Assistance  6: Modified independent (Device/Increase time)    Stair Management Technique  Two rails;Step to pattern    Number of Stairs  4    Height of Stairs  6      RT knee 3cm swelling compared to LT knee circumference       Objective measurements completed on examination: See above findings.                PT Short Term Goals - 11/21/17 1111      PT SHORT TERM GOAL #1   Title  Pt will initiate HEP in order to indicate improved functional mobility and reduced pain.      Baseline  no program    Time  3    Period  -- visits over 3 weeks    Status  New      PT SHORT TERM GOAL #2   Title  He will improve RT knee flexion to 90 degrees active to improve mobility out of chair    Baseline  uses chair for all mobility out of home. some in home  75 degrees flexion active    Time  3    Period  -- visits over 3 weeks    Status  New      PT SHORT TERM GOAL #3   Title  He will improve RT knee flexion to -20 degree sactive to inmprove gait pattern and increase walking in community    Baseline  wheel chair in community and knee ext -32 degrees    Time  3    Period  -- visits over 3 weeks  Status  New        PT Long Term Goals - 11/21/17 1113      PT LONG TERM GOAL #1   Title  Pt will be independent with final HEP in order to indicate improved functional mobility and decreased pain.     Baseline  independent with all HEP issued    Time  10    Period  Weeks    Status  New      PT LONG TERM GOAL #2   Title  Pt will report no more than 5/10 pain with functional mobility in order to indicate pain is reduced limiting factor.      Baseline  8-9/10    Time  10    Period  Weeks    Status  New      PT LONG TERM GOAL #3   Title  Pt will improve knee extension to -15 deg in order to  indicate improved ROM and functional in R knee.     Baseline  -20 degrees     Time  6    Period  Weeks    Status  New      PT LONG TERM GOAL #4   Title  Pt will improve R knee flexion to 110 deg (AROM) with decreased reports of pain in order to indicate improved functional in R knee and allow walking full time no use of wheel chair.     Baseline  90 degrees      PT LONG TERM GOAL #5   Title  He will report walking full time with SPC or less for all normal community activity    Baseline  using wheel chair for community activity    Time  10    Period  Weeks    Status  New      Additional Long Term Goals   Additional Long Term Goals  Yes      PT LONG TERM GOAL #6   Title  He will improve gluteal strength to min 4/5 to improve tolerance on feet with activity    Time  10    Period  Weeks    Status  New             Plan - 11/21/17 1122    Clinical Impression Statement  Mr Meditz is post TKA LT but also with history of THA RT and LT in past. He has significant limitations in ROM of RT knee and appears this past spring 2018 his ROM was significantly limnted at that time.   He really is able to walk well . He reports high pain levels but does not appear in any distress    History and Personal Factors relevant to plan of care:  TKA bilateral ,chronic RT knee pain nad limtations    Clinical Presentation  Stable    Clinical Decision Making  Low    Rehab Potential  Good    PT Frequency  -- 3 visits over 3 weeks then 2x/week     PT Duration  -- 2x/week for 6 weeks after 3 visits over 3 weeks    PT Treatment/Interventions  Cryotherapy;Stair training;Gait training;Functional mobility training;Therapeutic exercise;Patient/family education;Manual techniques;Taping;Vasopneumatic Device;Balance training    PT Next Visit Plan  MAnual for ROM , expand HEP , vaso, goit and balance    PT Home Exercise Plan  knee flexion and extension and hip RT extension stretching    Consulted and Agree with  Plan of Care  Patient       Patient will benefit from skilled therapeutic intervention in order to improve the following deficits and impairments:     Visit Diagnosis: Stiffness of right knee, not elsewhere classified  Chronic pain of right knee  Muscle weakness (generalized)  Difficulty in walking, not elsewhere classified     Problem List Patient Active Problem List   Diagnosis Date Noted  . Status post total right knee replacement 10/28/2017  . Dyslipidemia 10/12/2016  . Essential hypertension, benign 10/12/2016  . History of phlebitis 10/12/2016  . Unilateral primary osteoarthritis, left hip 10/08/2016  . Status post left hip replacement 10/08/2016    Caprice Red  PT 11/21/2017, 11:33 AM  [facility] [address] [place], [place], [address] Phone: [phone]   Fax:  [phone]  Name: Kyle Fleming MRN: [id number] Date of Birth: [birth year]

## 2017-11-29 ENCOUNTER — Telehealth (INDEPENDENT_AMBULATORY_CARE_PROVIDER_SITE_OTHER): Payer: Self-pay | Admitting: Physician Assistant

## 2017-11-29 MED ORDER — CYCLOBENZAPRINE HCL 10 MG PO TABS: 10.0000 mg | ORAL_TABLET | Freq: Two times a day (BID) | ORAL | 0 refills | Status: DC | PRN

## 2017-11-29 MED ORDER — OXYCODONE-ACETAMINOPHEN 5-325 MG PO TABS: 1.0000 | ORAL_TABLET | Freq: Four times a day (QID) | ORAL | 0 refills | Status: DC | PRN

## 2017-11-29 NOTE — Telephone Encounter (Signed)
Patient called also wants refill of Flexril.  Please call patient to advise

## 2017-11-29 NOTE — Telephone Encounter (Signed)
Can come and pick up script 

## 2017-11-29 NOTE — Telephone Encounter (Signed)
Patient called wanting a refill onoxyCODONE-acetaminophen (ROXICET) 5-325 MG tablet  Please call pt to advise.

## 2017-11-29 NOTE — Telephone Encounter (Signed)
Please advise 

## 2017-11-29 NOTE — Telephone Encounter (Signed)
See below

## 2017-11-29 NOTE — Telephone Encounter (Signed)
Patient aware Rx ready at front desk  

## 2017-12-01 ENCOUNTER — Ambulatory Visit: Payer: Medicaid Other

## 2017-12-01 DIAGNOSIS — M25661 Stiffness of right knee, not elsewhere classified: Secondary | ICD-10-CM

## 2017-12-01 DIAGNOSIS — M6281 Muscle weakness (generalized): Secondary | ICD-10-CM

## 2017-12-01 DIAGNOSIS — G8929 Other chronic pain: Secondary | ICD-10-CM

## 2017-12-01 DIAGNOSIS — R2689 Other abnormalities of gait and mobility: Secondary | ICD-10-CM

## 2017-12-01 DIAGNOSIS — R262 Difficulty in walking, not elsewhere classified: Secondary | ICD-10-CM

## 2017-12-01 DIAGNOSIS — M25561 Pain in right knee: Secondary | ICD-10-CM

## 2017-12-01 NOTE — Therapy (Signed)
[facility] [address] [place], [place], [address] Phone: [phone]   Fax:  [phone]  Physical Therapy Treatment  Patient Details  Name: Kyle Fleming MRN: [id number] Date of Birth: 1956-05-02 Referring Provider: Doneen Poisson, MD   Encounter Date: 12/01/2017  PT End of Session - 12/01/17 1104    Visit Number  2    Number of Visits  16    Date for PT Re-Evaluation  01/27/18    Authorization Type  Medicaid    PT Start Time  1045    PT Stop Time  1145    PT Time Calculation (min)  60 min    Activity Tolerance  Patient tolerated treatment well;No increased pain    Behavior During Therapy  WFL for tasks assessed/performed       Past Medical History:  Diagnosis Date  . Arthritis   . Hypertension   . Phlebitis    hx of in right leg - 2016     Past Surgical History:  Procedure Laterality Date  . JOINT REPLACEMENT  2003   right hip   . stents placed in right leg      AT [facility]- 2016   . TOTAL HIP ARTHROPLASTY Left 10/08/2016   Procedure: LEFT TOTAL HIP ARTHROPLASTY ANTERIOR APPROACH;  Surgeon: Kathryne Hitch, MD;  Location: [facility];  Service: Orthopedics;  Laterality: Left;  . TOTAL KNEE ARTHROPLASTY Right 10/28/2017   Procedure: RIGHT TOTAL KNEE ARTHROPLASTY;  Surgeon: Kathryne Hitch, MD;  Location: [facility];  Service: Orthopedics;  Laterality: Right;    There were no vitals filed for this visit.      [facility] PT Assessment - 12/01/17 0001      AROM   Right Knee Extension  -30    Right Knee Flexion  88                  [facility] Adult PT Treatment/Exercise - 12/01/17 0001      Neuro Re-ed    Neuro Re-ed Details   stance Rt leg moving pillow case with LT foot in arc x 15 mo LOB/       Knee/Hip Exercises: Aerobic   Nustep  L4 10 mn  LE        Knee/Hip Exercises: Standing   Heel Raises  Both;15 reps    Lateral Step Up  Right;15 reps;Hand Hold: 2;Step Height: 8"    Forward  Step Up  Right;15 reps;Hand Hold: 2;Step Height: 8"    Wall Squat  15 reps;3 seconds    Other Standing Knee Exercises  facxe wall reach with RT then LT hand overhead with weight to RT leg x15 eac      Knee/Hip Exercises: Seated   Long Arc Quad  Right    Long Arc Quad Weight  5 lbs.    Long [employer] Limitations  30 reps 5 sec at a time      Knee/Hip Exercises: Prone   Hamstring Curl  3 sets;10 reps    Hamstring Curl Limitations  5 pounds    Prone Knee Hang  1 minute    Prone Knee Hang Weights (lbs)  5    Prone Knee Hang Limitations  then 1 min no weight               PT Short Term Goals - 11/21/17 1111      PT SHORT TERM GOAL #1   Title  Pt will initiate HEP in order to  indicate improved functional mobility and reduced pain.      Baseline  no program    Time  3    Period  -- visits over 3 weeks    Status  New      PT SHORT TERM GOAL #2   Title  He will improve RT knee flexion to 90 degrees active to improve mobility out of chair    Baseline  uses chair for all mobility out of home. some in home  75 degrees flexion active    Time  3    Period  -- visits over 3 weeks    Status  New      PT SHORT TERM GOAL #3   Title  He will improve RT knee flexion to -20 degree sactive to inmprove gait pattern and increase walking in community    Baseline  wheel chair in community and knee ext -32 degrees    Time  3    Period  -- visits over 3 weeks    Status  New        PT Long Term Goals - 11/21/17 1113      PT LONG TERM GOAL #1   Title  Pt will be independent with final HEP in order to indicate improved functional mobility and decreased pain.     Baseline  independent with all HEP issued    Time  10    Period  Weeks    Status  New      PT LONG TERM GOAL #2   Title  Pt will report no more than 5/10 pain with functional mobility in order to indicate pain is reduced limiting factor.      Baseline  8-9/10    Time  10    Period  Weeks    Status  New      PT LONG TERM  GOAL #3   Title  Pt will improve knee extension to -15 deg in order to indicate improved ROM and functional in R knee.     Baseline  -20 degrees     Time  6    Period  Weeks    Status  New      PT LONG TERM GOAL #4   Title  Pt will improve R knee flexion to 110 deg (AROM) with decreased reports of pain in order to indicate improved functional in R knee and allow walking full time no use of wheel chair.     Baseline  90 degrees      PT LONG TERM GOAL #5   Title  He will report walking full time with SPC or less for all normal community activity    Baseline  using wheel chair for community activity    Time  10    Period  Weeks    Status  New      Additional Long Term Goals   Additional Long Term Goals  Yes      PT LONG TERM GOAL #6   Title  He will improve gluteal strength to min 4/5 to improve tolerance on feet with activity    Time  10    Period  Weeks    Status  New            Plan - 12/01/17 1140    Clinical Impression Statement  He did very well with all exercies . He reported throb of  Rt knee post but declined cold pack.  Flexion ROM increased  PT Treatment/Interventions  Cryotherapy;Stair training;Gait training;Functional mobility training;Therapeutic exercise;Patient/family education;Manual techniques;Taping;Vasopneumatic Device;Balance training    PT Next Visit Plan  Manual for ROM , expand HEP , vaso/cold if he wants post, gait and balance, strength    PT Home Exercise Plan  knee flexion and extension and hip RT extension stretching    Consulted and Agree with Plan of Care  Patient       Patient will benefit from skilled therapeutic intervention in order to improve the following deficits and impairments:  Pain, Difficulty walking, Decreased activity tolerance, Decreased range of motion, Decreased strength, Postural dysfunction, Increased edema  Visit Diagnosis: Stiffness of right knee, not elsewhere classified  Chronic pain of right knee  Muscle weakness  (generalized)  Difficulty in walking, not elsewhere classified  Other abnormalities of gait and mobility     Problem List Patient Active Problem List   Diagnosis Date Noted  . Status post total right knee replacement 10/28/2017  . Dyslipidemia 10/12/2016  . Essential hypertension, benign 10/12/2016  . History of phlebitis 10/12/2016  . Unilateral primary osteoarthritis, left hip 10/08/2016  . Status post left hip replacement 10/08/2016    Caprice RedChasse, Leeum Sankey M  PT 12/01/2017, 11:43 AM  [facility] [address] [place], [place], [address] Phone: [phone]   Fax:  [phone]  Name: Kyle Fleming MRN: [id number] Date of Birth: 1955-12-27

## 2017-12-08 ENCOUNTER — Ambulatory Visit: Payer: Medicaid Other

## 2017-12-13 ENCOUNTER — Ambulatory Visit: Payer: Medicaid Other

## 2017-12-13 DIAGNOSIS — M25661 Stiffness of right knee, not elsewhere classified: Secondary | ICD-10-CM

## 2017-12-13 DIAGNOSIS — R2689 Other abnormalities of gait and mobility: Secondary | ICD-10-CM

## 2017-12-13 DIAGNOSIS — G8929 Other chronic pain: Secondary | ICD-10-CM

## 2017-12-13 DIAGNOSIS — M6281 Muscle weakness (generalized): Secondary | ICD-10-CM

## 2017-12-13 DIAGNOSIS — M25561 Pain in right knee: Secondary | ICD-10-CM

## 2017-12-13 DIAGNOSIS — R262 Difficulty in walking, not elsewhere classified: Secondary | ICD-10-CM

## 2017-12-13 NOTE — Therapy (Signed)
[place], [place], [address] Phone: [phone]   Fax:  [phone]  Physical Therapy Evaluation  Patient Details  Name: Kyle Fleming MRN: [id number] Date of Birth: 02/26/1956 Referring Provider: Jean Rosenthal, MD   Encounter Date: 12/13/2017  PT End of Session - 12/13/17 1019    Visit Number  3    Number of Visits  16    Date for PT Re-Evaluation  01/27/18    Authorization Type  Medicaid    Authorization Time Period  to 12/18/17    Authorization - Visit Number  2    Authorization - Number of Visits  3    PT Start Time  1020    PT Stop Time  1123    PT Time Calculation (min)  63 min    Activity Tolerance  Patient tolerated treatment well;No increased pain    Behavior During Therapy  WFL for tasks assessed/performed       Past Medical History:  Diagnosis Date  . Arthritis   . Hypertension   . Phlebitis    hx of in right leg - 2016     Past Surgical History:  Procedure Laterality Date  . JOINT REPLACEMENT  2003   right hip   . stents placed in right leg      AT [facility]- 2016   . TOTAL HIP ARTHROPLASTY Left 10/08/2016   Procedure: LEFT TOTAL HIP ARTHROPLASTY ANTERIOR APPROACH;  Surgeon: Mcarthur Rossetti, MD;  Location: [facility];  Service: Orthopedics;  Laterality: Left;  . TOTAL KNEE ARTHROPLASTY Right 10/28/2017   Procedure: RIGHT TOTAL KNEE ARTHROPLASTY;  Surgeon: Mcarthur Rossetti, MD;  Location: [facility];  Service: Orthopedics;  Laterality: Right;    There were no vitals filed for this visit.   Subjective Assessment - 12/13/17 1022    Subjective  Walking now , limited use of WC. Been 3 yw=ears with WC.   On feet  for much of 18 hours so more swollen.     Pain Score  8     Pain Location  Knee    Pain Orientation  Right    Pain Descriptors / Indicators  Burning;Sharp    Pain Type  Chronic pain    Pain Onset  More than a month ago    Pain Frequency  Constant    Aggravating Factors   weight bearing    Pain Relieving Factors  rest         [facility] PT Assessment - 12/13/17 0001      AROM   Right Knee Extension  -22    Right Knee Flexion  95      PROM   Right Knee Extension  -18             Objective measurements completed on examination: See above findings.      [place] Adult PT Treatment/Exercise - 12/13/17 0001      Knee/Hip Exercises: Aerobic   Nustep  L4 10 mn  LE        Knee/Hip Exercises: Standing   Other Standing Knee Exercises  side stepping green band on ankles x10 RT/LT 5 feet each diretion      Knee/Hip Exercises: Seated   Sit to Sand  10 reps;without UE support      Knee/Hip Exercises: Supine   Short Arc Quad Sets  Right;Limitations    Short Arc Quad Sets Limitations  8 pounds x 50 rpes    Bridges  Limitations  Bridges Limitations  legs on red ball x 40 reps     Knee Flexion  Limitations    Knee Flexion Limitations  legs on ball x 50 reps active      Manual Therapy   Manual Therapy  Passive ROM    Passive ROM  flexion and extension stretching                PT Short Term Goals - 12/13/17 1128      PT SHORT TERM GOAL #1   Title  Pt will initiate HEP in order to indicate improved functional mobility and reduced pain.      Baseline  able to do HEP issued    Status  Achieved      PT SHORT TERM GOAL #2   Title  He will improve RT knee flexion to 90 degrees active to improve mobility out of chair    Baseline  95 degrees today    Status  Achieved      PT SHORT TERM GOAL #3   Title  He will improve RT knee extension  to -20 degree sactive to inmprove gait pattern and increase walking in community    Baseline  -22 today and walking in community     Status  Achieved        PT Long Term Goals - 12/13/17 1130      PT LONG TERM GOAL #1   Title  Pt will be independent with final HEP in order to indicate improved functional mobility and decreased pain.     Baseline  adding to initial HEP    Status   On-going      PT LONG TERM GOAL #2   Title  Pt will report no more than 5/10 pain with functional mobility in order to indicate pain is reduced limiting factor.      Baseline  8/10 today    Status  On-going      PT LONG TERM GOAL #3   Title  Pt will improve knee extension to -15 deg in order to indicate improved ROM and functional in R knee.     Baseline  -22 today    Status  On-going      PT LONG TERM GOAL #4   Title  Pt will improve R knee flexion to 110 deg (AROM) with decreased reports of pain in order to indicate improved functional in R knee and allow walking full time no use of wheel chair.     Baseline  95 degrees today    Status  On-going      PT LONG TERM GOAL #5   Title  He will report walking full time with SPC or less for all normal community activity    Baseline  minuse of WC now    Status  Partially Met      PT LONG TERM GOAL #6   Title  He will improve gluteal strength to min 4/5 to improve tolerance on feet with activity    Status  Unable to assess             Plan - 12/13/17 1025    Clinical Impression Statement  Now walking in community.  Pain levels reports high though no appearance of distress. No need for modality at end. He appears to be woking hard . Active ROM improved. Will benefit from continued PT    PT Frequency  2x / week    PT Duration  6 weeks  PT Treatment/Interventions  Cryotherapy;Stair training;Gait training;Functional mobility training;Therapeutic exercise;Patient/family education;Manual techniques;Taping;Vasopneumatic Device;Balance training    PT Next Visit Plan  Manual for ROM , expand HEP , vaso/cold if he wants post, gait and balance, strength    PT Home Exercise Plan  knee flexion and extension and hip RT extension stretching, side stepping with green band    Consulted and Agree with Plan of Care  Patient       Patient will benefit from skilled therapeutic intervention in order to improve the following deficits and impairments:   Pain, Difficulty walking, Decreased activity tolerance, Decreased range of motion, Decreased strength, Postural dysfunction, Increased edema  Visit Diagnosis: Stiffness of right knee, not elsewhere classified  Chronic pain of right knee  Muscle weakness (generalized)  Difficulty in walking, not elsewhere classified  Other abnormalities of gait and mobility     Problem List Patient Active Problem List   Diagnosis Date Noted  . Status post total right knee replacement 10/28/2017  . Dyslipidemia 10/12/2016  . Essential hypertension, benign 10/12/2016  . History of phlebitis 10/12/2016  . Unilateral primary osteoarthritis, left hip 10/08/2016  . Status post left hip replacement 10/08/2016    Darrel Hoover  PT 12/13/2017, 11:39 AM  [facility] [address] [place], [place], [address] Phone: [phone]   Fax:  [phone]  Name: Kyle Fleming MRN: [id number] Date of Birth: 14-Mar-1956

## 2017-12-15 ENCOUNTER — Ambulatory Visit (INDEPENDENT_AMBULATORY_CARE_PROVIDER_SITE_OTHER): Payer: Medicaid Other | Admitting: Physician Assistant

## 2017-12-15 ENCOUNTER — Encounter (INDEPENDENT_AMBULATORY_CARE_PROVIDER_SITE_OTHER): Payer: Self-pay | Admitting: Physician Assistant

## 2017-12-15 DIAGNOSIS — Z96651 Presence of right artificial knee joint: Secondary | ICD-10-CM

## 2017-12-15 MED ORDER — OXYCODONE-ACETAMINOPHEN 5-325 MG PO TABS: 1.0000 | ORAL_TABLET | Freq: Four times a day (QID) | ORAL | 0 refills | Status: DC | PRN

## 2017-12-15 MED ORDER — CYCLOBENZAPRINE HCL 10 MG PO TABS: 10.0000 mg | ORAL_TABLET | Freq: Two times a day (BID) | ORAL | 0 refills | Status: DC | PRN

## 2017-12-15 NOTE — Progress Notes (Signed)
Office Visit Note   Patient: Kyle Fleming           Date of Birth: 07-28-1956           MRN: [id number] Visit Date: 12/15/2017              Requested by: No referring provider defined for this encounter. PCP: Patient, No Pcp Per   Assessment & Plan: Visit Diagnoses:  1. Status post total right knee replacement     Plan: He will continue to work on his range of motion strengthening of the right knee.  Continue physical therapy for the knee.  He is given a refill on Roxicet and his muscle relaxer.  The last refill on the Roxicet. .  Follow-Up Instructions: Return in about 4 weeks (around 01/12/2018).   Orders:  No orders of the defined types were placed in this encounter.  Meds ordered this encounter  Medications  . cyclobenzaprine (FLEXERIL) 10 MG tablet    Sig: Take 1 tablet (10 mg total) by mouth 2 (two) times daily as needed for muscle spasms.    Dispense:  60 tablet    Refill:  0  . oxyCODONE-acetaminophen (ROXICET) 5-325 MG tablet    Sig: Take 1-2 tablets by mouth every 6 (six) hours as needed.    Dispense:  60 tablet    Refill:  0      Procedures: No procedures performed   Clinical Data: No additional findings.   Subjective: Chief Complaint  Patient presents with  . Right Knee - Routine Post Op    HPI Kyle Fleming returns today 48 days status post right total knee arthroplasty.  States overall he is doing fantastic.  States physical therapy is killing him.  Asking for refill on his pain medicine and muscle relaxant.  He is walking without any assistive device. Review of Systems No fevers chills shortness of breath chest pain   Objective: Vital Signs: There were no vitals taken for this visit.  Physical Exam General: Well-developed well-nourished male in no acute distress mood affect appropriate. Ortho Exam Right knee able to bring in full extension however he likes to hold the leg at about 3-5 degrees of flexion.  Bring 200 degrees of flexion.   Calf supple nontender.  Surgical incisions healing well no signs of reaction. Specialty Comments:  No specialty comments available.  Imaging: No results found.   PMFS History: Patient Active Problem List   Diagnosis Date Noted  . Status post total right knee replacement 10/28/2017  . Dyslipidemia 10/12/2016  . Essential hypertension, benign 10/12/2016  . History of phlebitis 10/12/2016  . Unilateral primary osteoarthritis, left hip 10/08/2016  . Status post left hip replacement 10/08/2016   Past Medical History:  Diagnosis Date  . Arthritis   . Hypertension   . Phlebitis    hx of in right leg - 2016     History reviewed. No pertinent family history.  Past Surgical History:  Procedure Laterality Date  . JOINT REPLACEMENT  2003   right hip   . stents [facility] in right leg      [facility]   . TOTAL HIP ARTHROPLASTY Left 10/08/2016   Procedure: LEFT TOTAL HIP ARTHROPLASTY ANTERIOR APPROACH;  Surgeon: Kathryne Hitchhristopher Y Blackman, MD;  Location: [facility];  Service: Orthopedics;  Laterality: Left;  . TOTAL KNEE ARTHROPLASTY Right 10/28/2017   Procedure: RIGHT TOTAL KNEE ARTHROPLASTY;  Surgeon: Kathryne HitchBlackman, Christopher Y, MD;  Location: [facility];  Service: Orthopedics;  Laterality:  Right;   Social History   Occupational History  . Not on file  Tobacco Use  . Smoking status: Former Smoker    Types: Cigars  . Smokeless tobacco: Never Used  Substance and Sexual Activity  . Alcohol use: Yes    Comment: 3 beeers daily on weekends   . Drug use: No  . Sexual activity: Yes

## 2017-12-19 ENCOUNTER — Ambulatory Visit: Payer: Medicaid Other | Admitting: Physical Therapy

## 2017-12-27 ENCOUNTER — Ambulatory Visit: Payer: Medicaid Other | Attending: Orthopaedic Surgery | Admitting: Physical Therapy

## 2017-12-27 ENCOUNTER — Encounter: Payer: Self-pay | Admitting: Physical Therapy

## 2017-12-27 DIAGNOSIS — R2689 Other abnormalities of gait and mobility: Secondary | ICD-10-CM | POA: Insufficient documentation

## 2017-12-27 DIAGNOSIS — M25661 Stiffness of right knee, not elsewhere classified: Secondary | ICD-10-CM | POA: Diagnosis present

## 2017-12-27 DIAGNOSIS — M25561 Pain in right knee: Secondary | ICD-10-CM | POA: Diagnosis present

## 2017-12-27 DIAGNOSIS — M6281 Muscle weakness (generalized): Secondary | ICD-10-CM | POA: Diagnosis not present

## 2017-12-27 DIAGNOSIS — R262 Difficulty in walking, not elsewhere classified: Secondary | ICD-10-CM | POA: Diagnosis not present

## 2017-12-27 DIAGNOSIS — G8929 Other chronic pain: Secondary | ICD-10-CM | POA: Diagnosis present

## 2017-12-27 NOTE — Therapy (Signed)
[place], [place], [address] Phone: [phone]   Fax:  [phone]  Physical Therapy Treatment  Patient Details  Name: Kyle Fleming MRN: [id number] Date of Birth: 1956-03-08 Referring Provider: Jean Rosenthal, MD   Encounter Date: 12/27/2017  PT End of Session - 12/27/17 1218    Visit Number  4    Number of Visits  16    Date for PT Re-Evaluation  01/27/18    Authorization Type  Medicaid    Authorization Time Period  to 12/18/17    Authorization - Number of Visits  12    PT Start Time  3419    PT Stop Time  1230    PT Time Calculation (min)  45 min    Activity Tolerance  Patient tolerated treatment well;No increased pain    Behavior During Therapy  WFL for tasks assessed/performed       Past Medical History:  Diagnosis Date  . Arthritis   . Hypertension   . Phlebitis    hx of in right leg - 2016     Past Surgical History:  Procedure Laterality Date  . JOINT REPLACEMENT  2003   right hip   . stents placed in right leg      AT [facility]- 2016   . TOTAL HIP ARTHROPLASTY Left 10/08/2016   Procedure: LEFT TOTAL HIP ARTHROPLASTY ANTERIOR APPROACH;  Surgeon: Mcarthur Rossetti, MD;  Location: [facility];  Service: Orthopedics;  Laterality: Left;  . TOTAL KNEE ARTHROPLASTY Right 10/28/2017   Procedure: RIGHT TOTAL KNEE ARTHROPLASTY;  Surgeon: Mcarthur Rossetti, MD;  Location: [facility];  Service: Orthopedics;  Laterality: Right;    There were no vitals filed for this visit.      [facility] PT Assessment - 12/27/17 0001      AROM   AROM Assessment Site  Knee    Right/Left Knee  Right    Right Knee Extension  20    Right Knee Flexion  95      PROM   Right Knee Extension  -18 with overpressure    Right Knee Flexion  100                  [facility] Adult PT Treatment/Exercise - 12/27/17 0001      Knee/Hip Exercises: Stretches   Active Hamstring Stretch  Right;3 reps;30 seconds       Knee/Hip Exercises: Aerobic   Nustep  L5 x 7 minutes No UE support      Knee/Hip Exercises: Standing   Lateral Step Up  Right;15 reps;Hand Hold: 1    Forward Step Up  Right;15 reps    Wall Squat  15 reps;3 seconds    Other Standing Knee Exercises  --      Knee/Hip Exercises: Seated   Heel Slides  AROM;20 reps;Limitations    Heel Slides Limitations  pillow underneath, x 2 sets    Sit to Sand  10 reps;without UE support      Knee/Hip Exercises: Supine   Short Arc Quad Sets  Right;Limitations    Short Arc Quad Sets Limitations  8# x 20 reps    Bridges  Limitations    Bridges Limitations  legs on red ball x 40 reps     Knee Flexion  Limitations    Knee Flexion Limitations  legs on ball x 50 reps active      Manual Therapy   Manual Therapy  Passive ROM  Passive ROM  flexion and extension stretching                PT Short Term Goals - 12/13/17 1128      PT SHORT TERM GOAL #1   Title  Pt will initiate HEP in order to indicate improved functional mobility and reduced pain.      Baseline  able to do HEP issued    Status  Achieved      PT SHORT TERM GOAL #2   Title  He will improve RT knee flexion to 90 degrees active to improve mobility out of chair    Baseline  95 degrees today    Status  Achieved      PT SHORT TERM GOAL #3   Title  He will improve RT knee extension  to -20 degree sactive to inmprove gait pattern and increase walking in community    Baseline  -22 today and walking in community     Status  Achieved        PT Long Term Goals - 12/27/17 1221      PT LONG TERM GOAL #1   Title  Pt will be independent with final HEP in order to indicate improved functional mobility and decreased pain.     Baseline  adding to initial HEP    Period  Weeks    Status  On-going      PT LONG TERM GOAL #2   Title  Pt will report no more than 5/10 pain with functional mobility in order to indicate pain is reduced limiting factor.      Baseline  8/10 today     Time  10    Period  Weeks    Status  On-going      PT LONG TERM GOAL #3   Title  Pt will improve knee extension to -15 deg in order to indicate improved ROM and functional in R knee.     Baseline  -22 today    Time  6    Period  Weeks    Status  On-going      PT LONG TERM GOAL #4   Title  Pt will improve R knee flexion to 110 deg (AROM) with decreased reports of pain in order to indicate improved functional in R knee and allow walking full time no use of wheel chair.     Baseline  95 degrees    Status  On-going      PT LONG TERM GOAL #5   Title  He will report walking full time with SPC or less for all normal community activity    Baseline  Pt no longer using SPC, but still amb with antalgic gait    Time  10    Period  Weeks    Status  Partially Met      PT LONG TERM GOAL #6   Title  He will improve gluteal strength to min 4/5 to improve tolerance on feet with activity    Time  10    Period  Weeks    Status  Unable to assess              Patient will benefit from skilled therapeutic intervention in order to improve the following deficits and impairments:     Visit Diagnosis: Stiffness of right knee, not elsewhere classified  Chronic pain of right knee  Muscle weakness (generalized)  Difficulty in walking, not elsewhere classified  Other abnormalities of gait and mobility  Problem List Patient Active Problem List   Diagnosis Date Noted  . Status post total right knee replacement 10/28/2017  . Dyslipidemia 10/12/2016  . Essential hypertension, benign 10/12/2016  . History of phlebitis 10/12/2016  . Unilateral primary osteoarthritis, left hip 10/08/2016  . Status post left hip replacement 10/08/2016    Oretha Caprice, MPT 12/27/2017, 12:30 PM  [facility] [address] [place], [place], [address] Phone: [phone]   Fax:  [phone]  Name: Kyle Fleming MRN: [id number] Date of Birth:  [birth year]

## 2017-12-29 ENCOUNTER — Ambulatory Visit: Payer: Medicaid Other

## 2017-12-29 DIAGNOSIS — G8929 Other chronic pain: Secondary | ICD-10-CM

## 2017-12-29 DIAGNOSIS — R2689 Other abnormalities of gait and mobility: Secondary | ICD-10-CM

## 2017-12-29 DIAGNOSIS — M25561 Pain in right knee: Secondary | ICD-10-CM

## 2017-12-29 DIAGNOSIS — M25661 Stiffness of right knee, not elsewhere classified: Secondary | ICD-10-CM | POA: Diagnosis not present

## 2017-12-29 DIAGNOSIS — M6281 Muscle weakness (generalized): Secondary | ICD-10-CM

## 2017-12-29 DIAGNOSIS — R262 Difficulty in walking, not elsewhere classified: Secondary | ICD-10-CM

## 2017-12-29 NOTE — Therapy (Signed)
[place], [place], [address] Phone: [phone]   Fax:  [phone]  Physical Therapy Treatment  Patient Details  Name: Kyle Fleming MRN: [id number] Date of Birth: 12/27/1955 Referring Provider: Jean Rosenthal, MD   Encounter Date: 12/29/2017  PT End of Session - 12/29/17 1523    Visit Number  5    Number of Visits  16    Date for PT Re-Evaluation  01/27/18    Authorization Type  Medicaid    Authorization Time Period   12/20/17 to 01/30/18    Authorization - Visit Number  3    Authorization - Number of Visits  12    PT Start Time  0330    PT Stop Time  0430    PT Time Calculation (min)  60 min    Activity Tolerance  Patient tolerated treatment well;No increased pain    Behavior During Therapy  WFL for tasks assessed/performed       Past Medical History:  Diagnosis Date  . Arthritis   . Hypertension   . Phlebitis    hx of in right leg - 2016     Past Surgical History:  Procedure Laterality Date  . JOINT REPLACEMENT  2003   right hip   . stents placed in right leg      AT [facility]- 2016   . TOTAL HIP ARTHROPLASTY Left 10/08/2016   Procedure: LEFT TOTAL HIP ARTHROPLASTY ANTERIOR APPROACH;  Surgeon: Mcarthur Rossetti, MD;  Location: [facility];  Service: Orthopedics;  Laterality: Left;  . TOTAL KNEE ARTHROPLASTY Right 10/28/2017   Procedure: RIGHT TOTAL KNEE ARTHROPLASTY;  Surgeon: Mcarthur Rossetti, MD;  Location: [facility];  Service: Orthopedics;  Laterality: Right;    There were no vitals filed for this visit.      [facility] PT Assessment - 12/29/17 0001      AROM   Right Knee Extension  -23 TKE into mat    Right Knee Flexion  105                  [facility] Adult PT Treatment/Exercise - 12/29/17 0001      Neuro Re-ed    Neuro Re-ed Details   discussed the benefits of working on heel contact with stance leg to get muscles to       Knee/Hip Exercises: Aerobic   Nustep   L5 x 15 minutes No UE support      Knee/Hip Exercises: Standing   Other Standing Knee Exercises  hamstring curls x 25  5 pounds  then fce wall and reach RT hand then Lt hand up wa with Lt leg lift and cue for glut and quad contraction x 10 each.       Knee/Hip Exercises: Seated   Long Arc Quad  Right    Long Arc Quad Weight  5 lbs.    Long [employer] Limitations  30 reps 5 sec at a time      Knee/Hip Exercises: Supine   Straight Leg Raises  Right;10 reps      Knee/Hip Exercises: Sidelying   Hip ABduction  Right;15 reps;Limitations    Hip ABduction Limitations  cued for position      Knee/Hip Exercises: Prone   Hamstring Curl  10 reps    Hamstring Curl Limitations  stopped due to hose pressure on knee      Manual Therapy   Passive ROM  flexion and extension stretching with joint mobs GR  4 AP and PA  along with patella mobs superior and inferior  glides               PT Short Term Goals - 12/29/17 1728      PT SHORT TERM GOAL #1   Title  Pt will initiate HEP in order to indicate improved functional mobility and reduced pain.      Status  Achieved      PT SHORT TERM GOAL #2   Title  He will improve RT knee flexion to 90 degrees active to improve mobility out of chair    Status  Achieved      PT SHORT TERM GOAL #3   Title  He will improve RT knee extension  to -20 degree s active extension  to inmprove gait pattern and increase walking in community    Baseline  -22 today and walking in community     Status  On-going        PT Long Term Goals - 12/27/17 1221      PT LONG TERM GOAL #1   Title  Pt will be independent with final HEP in order to indicate improved functional mobility and decreased pain.     Baseline  adding to initial HEP    Period  Weeks    Status  On-going      PT LONG TERM GOAL #2   Title  Pt will report no more than 5/10 pain with functional mobility in order to indicate pain is reduced limiting factor.      Baseline  8/10 today    Time  10     Period  Weeks    Status  On-going      PT LONG TERM GOAL #3   Title  Pt will improve knee extension to -15 deg in order to indicate improved ROM and functional in R knee.     Baseline  -22 today    Time  6    Period  Weeks    Status  On-going      PT LONG TERM GOAL #4   Title  Pt will improve R knee flexion to 110 deg (AROM) with decreased reports of pain in order to indicate improved functional in R knee and allow walking full time no use of wheel chair.     Baseline  95 degrees    Status  On-going      PT LONG TERM GOAL #5   Title  He will report walking full time with SPC or less for all normal community activity    Baseline  Pt no longer using SPC, but still amb with antalgic gait    Time  10    Period  Weeks    Status  Partially Met      PT LONG TERM GOAL #6   Title  He will improve gluteal strength to min 4/5 to improve tolerance on feet with activity    Time  10    Period  Weeks    Status  Unable to assess            Plan - 12/29/17 1525    Clinical Impression Statement  Doing well .  aROM flexion improved extension not.  He has concerns about heel strike difficulties and he was encouraged to work on this and it should improve though will be limited by stiffness of RT knee.     PT Treatment/Interventions  Cryotherapy;Stair training;Gait training;Functional mobility training;Therapeutic exercise;Patient/family education;Manual techniques;Taping;Vasopneumatic  Device;Balance training    PT Next Visit Plan  Manual for ROM , expand HEP , vaso/cold if he wants post, gait and balance, strength    PT Home Exercise Plan  knee flexion and extension and hip RT extension stretching, side stepping with green band    Consulted and Agree with Plan of Care  Patient       Patient will benefit from skilled therapeutic intervention in order to improve the following deficits and impairments:  Pain, Difficulty walking, Decreased activity tolerance, Decreased range of motion, Decreased  strength, Postural dysfunction, Increased edema  Visit Diagnosis: Stiffness of right knee, not elsewhere classified  Chronic pain of right knee  Muscle weakness (generalized)  Difficulty in walking, not elsewhere classified  Other abnormalities of gait and mobility     Problem List Patient Active Problem List   Diagnosis Date Noted  . Status post total right knee replacement 10/28/2017  . Dyslipidemia 10/12/2016  . Essential hypertension, benign 10/12/2016  . History of phlebitis 10/12/2016  . Unilateral primary osteoarthritis, left hip 10/08/2016  . Status post left hip replacement 10/08/2016    Darrel Hoover  PT 12/29/2017, 5:30 PM  [facility] [address] [place], [place], [address] Phone: [phone]   Fax:  [phone]  Name: Kyle Fleming MRN: [id number] Date of Birth: 12-15-1955

## 2017-12-30 ENCOUNTER — Telehealth (INDEPENDENT_AMBULATORY_CARE_PROVIDER_SITE_OTHER): Payer: Self-pay | Admitting: Orthopaedic Surgery

## 2017-12-30 NOTE — Telephone Encounter (Signed)
Patient advised needing Rx refilled Oxycontin and his muscle relaxer. The number to contact patient is [phone]

## 2018-01-02 MED ORDER — OXYCODONE-ACETAMINOPHEN 5-325 MG PO TABS: 1.0000 | ORAL_TABLET | Freq: Three times a day (TID) | ORAL | 0 refills | Status: DC | PRN

## 2018-01-02 MED ORDER — CYCLOBENZAPRINE HCL 10 MG PO TABS: 10.0000 mg | ORAL_TABLET | Freq: Two times a day (BID) | ORAL | 0 refills | Status: DC | PRN

## 2018-01-02 NOTE — Telephone Encounter (Signed)
Patient aware Rx ready at front desk  

## 2018-01-02 NOTE — Telephone Encounter (Signed)
Can come and pick up scripts 

## 2018-01-02 NOTE — Telephone Encounter (Signed)
Please advise 

## 2018-01-03 ENCOUNTER — Ambulatory Visit: Payer: Medicaid Other

## 2018-01-03 DIAGNOSIS — R262 Difficulty in walking, not elsewhere classified: Secondary | ICD-10-CM

## 2018-01-03 DIAGNOSIS — M6281 Muscle weakness (generalized): Secondary | ICD-10-CM

## 2018-01-03 DIAGNOSIS — M25661 Stiffness of right knee, not elsewhere classified: Secondary | ICD-10-CM

## 2018-01-03 DIAGNOSIS — G8929 Other chronic pain: Secondary | ICD-10-CM

## 2018-01-03 DIAGNOSIS — M25561 Pain in right knee: Secondary | ICD-10-CM

## 2018-01-03 DIAGNOSIS — R2689 Other abnormalities of gait and mobility: Secondary | ICD-10-CM

## 2018-01-03 NOTE — Therapy (Signed)
[place], [place], [address] Phone: [phone]   Fax:  [phone]  Physical Therapy Treatment  Patient Details  Name: Kyle Fleming MRN: [id number] Date of Birth: 1956-04-19 Referring Provider: Jean Rosenthal, MD   Encounter Date: 01/03/2018  PT End of Session - 01/03/18 1231    Visit Number  6    Number of Visits  16    Date for PT Re-Evaluation  01/27/18    Authorization Type  Medicaid    Authorization Time Period   12/20/17 to 01/30/18    Authorization - Visit Number  4    Authorization - Number of Visits  12    PT Start Time  9480    PT Stop Time  1233    PT Time Calculation (min)  46 min    Activity Tolerance  Patient tolerated treatment well;No increased pain    Behavior During Therapy  WFL for tasks assessed/performed       Past Medical History:  Diagnosis Date  . Arthritis   . Hypertension   . Phlebitis    hx of in right leg - 2016     Past Surgical History:  Procedure Laterality Date  . JOINT REPLACEMENT  2003   right hip   . stents placed in right leg      AT [facility]- 2016   . TOTAL HIP ARTHROPLASTY Left 10/08/2016   Procedure: LEFT TOTAL HIP ARTHROPLASTY ANTERIOR APPROACH;  Surgeon: Mcarthur Rossetti, MD;  Location: [facility];  Service: Orthopedics;  Laterality: Left;  . TOTAL KNEE ARTHROPLASTY Right 10/28/2017   Procedure: RIGHT TOTAL KNEE ARTHROPLASTY;  Surgeon: Mcarthur Rossetti, MD;  Location: [facility];  Service: Orthopedics;  Laterality: Right;    There were no vitals filed for this visit.  Subjective Assessment - 01/03/18 1158    Subjective  Doing well . Pain moderate 5.5 /10. No device.   Your stretching really helps.    Currently in Pain?  Yes    Pain Score  5     Pain Location  Knee    Pain Orientation  Right    Pain Descriptors / Indicators  Burning;Sharp    Pain Type  Chronic pain    Pain Onset  More than a month ago    Pain Frequency  Constant    Aggravating Factors   weight bearing , stretch    Pain Relieving Factors  rest         [facility] PT Assessment - 01/03/18 0001      AROM   Right Knee Flexion  108                  [facility] Adult PT Treatment/Exercise - 01/03/18 0001      Knee/Hip Exercises: Aerobic   Nustep  L5 x 8 minutes No UE support      Knee/Hip Exercises: Standing   Lateral Step Up  Right;10 reps;Limitations;Hand Hold: 1    Lateral Step Up Limitations  11 inch step    Forward Step Up  15 reps;Hand Hold: 1;Limitations    Forward Step Up Limitations  11 inch step    Wall Squat  10 reps;10 seconds      Knee/Hip Exercises: Seated   Long Arc Quad  Right    Long Arc Quad Weight  8 lbs.    Long [employer] Limitations  30 reps 5 sec at a time      Manual Therapy   Passive  ROM  flexion and extension stretching with joint mobs GR 4 AP and PA  along with patella mobs superior and inferior  glides               PT Short Term Goals - 12/29/17 1728      PT SHORT TERM GOAL #1   Title  Pt will initiate HEP in order to indicate improved functional mobility and reduced pain.      Status  Achieved      PT SHORT TERM GOAL #2   Title  He will improve RT knee flexion to 90 degrees active to improve mobility out of chair    Status  Achieved      PT SHORT TERM GOAL #3   Title  He will improve RT knee extension  to -20 degree s active extension  to inmprove gait pattern and increase walking in community    Baseline  -22 today and walking in community     Status  On-going        PT Long Term Goals - 12/27/17 1221      PT LONG TERM GOAL #1   Title  Pt will be independent with final HEP in order to indicate improved functional mobility and decreased pain.     Baseline  adding to initial HEP    Period  Weeks    Status  On-going      PT LONG TERM GOAL #2   Title  Pt will report no more than 5/10 pain with functional mobility in order to indicate pain is reduced limiting factor.      Baseline  8/10  today    Time  10    Period  Weeks    Status  On-going      PT LONG TERM GOAL #3   Title  Pt will improve knee extension to -15 deg in order to indicate improved ROM and functional in R knee.     Baseline  -22 today    Time  6    Period  Weeks    Status  On-going      PT LONG TERM GOAL #4   Title  Pt will improve R knee flexion to 110 deg (AROM) with decreased reports of pain in order to indicate improved functional in R knee and allow walking full time no use of wheel chair.     Baseline  95 degrees    Status  On-going      PT LONG TERM GOAL #5   Title  He will report walking full time with SPC or less for all normal community activity    Baseline  Pt no longer using SPC, but still amb with antalgic gait    Time  10    Period  Weeks    Status  Partially Met      PT LONG TERM GOAL #6   Title  He will improve gluteal strength to min 4/5 to improve tolerance on feet with activity    Time  10    Period  Weeks    Status  Unable to assess            Plan - 01/03/18 1232    Clinical Impression Statement  Sore post session. Pt declined modalitiy after he "walked it out".   His flexion ROM incr. extesnion no change. We continues to work hard to improve strength and ROM.        PT Treatment/Interventions  Cryotherapy;Stair training;Gait training;Functional  mobility training;Therapeutic exercise;Patient/family education;Manual techniques;Taping;Vasopneumatic Device;Balance training    PT Next Visit Plan  Manual for ROM , expand HEP , vaso/cold if he wants post, gait and balance, strength    PT Home Exercise Plan  knee flexion and extension and hip RT extension stretching, side stepping with green band    Consulted and Agree with Plan of Care  Patient       Patient will benefit from skilled therapeutic intervention in order to improve the following deficits and impairments:  Pain, Difficulty walking, Decreased activity tolerance, Decreased range of motion, Decreased strength,  Postural dysfunction, Increased edema  Visit Diagnosis: Stiffness of right knee, not elsewhere classified  Chronic pain of right knee  Muscle weakness (generalized)  Difficulty in walking, not elsewhere classified  Other abnormalities of gait and mobility     Problem List Patient Active Problem List   Diagnosis Date Noted  . Status post total right knee replacement 10/28/2017  . Dyslipidemia 10/12/2016  . Essential hypertension, benign 10/12/2016  . History of phlebitis 10/12/2016  . Unilateral primary osteoarthritis, left hip 10/08/2016  . Status post left hip replacement 10/08/2016    Darrel Hoover  PT 01/03/2018, 12:35 PM  [place] [facility] [address] [place], [place], [address] Phone: [phone]   Fax:  [phone]  Name: Kyle Fleming MRN: [id number] Date of Birth: 21-Aug-1956

## 2018-01-05 ENCOUNTER — Ambulatory Visit: Payer: Medicaid Other

## 2018-01-05 DIAGNOSIS — M25661 Stiffness of right knee, not elsewhere classified: Secondary | ICD-10-CM

## 2018-01-05 DIAGNOSIS — M6281 Muscle weakness (generalized): Secondary | ICD-10-CM

## 2018-01-05 DIAGNOSIS — R2689 Other abnormalities of gait and mobility: Secondary | ICD-10-CM

## 2018-01-05 DIAGNOSIS — R262 Difficulty in walking, not elsewhere classified: Secondary | ICD-10-CM

## 2018-01-05 DIAGNOSIS — M25561 Pain in right knee: Secondary | ICD-10-CM

## 2018-01-05 DIAGNOSIS — G8929 Other chronic pain: Secondary | ICD-10-CM

## 2018-01-05 NOTE — Therapy (Signed)
[facility] [address] [place], [place], [address] Phone: [phone]   Fax:  [phone]  Physical Therapy Treatment  Patient Details  Name: Kyle Fleming MRN: [id number] Date of Birth: 1956-01-11 Referring Provider: Doneen Poisson, MD   Encounter Date: 01/05/2018  PT End of Session - 01/05/18 1107    Visit Number  7    Number of Visits  16    Date for PT Re-Evaluation  01/27/18    Authorization Type  Medicaid    Authorization Time Period   12/20/17 to 01/30/18    Authorization - Visit Number  5    Authorization - Number of Visits  12    PT Start Time  1105    PT Stop Time  1145    PT Time Calculation (min)  40 min    Activity Tolerance  Patient tolerated treatment well;No increased pain    Behavior During Therapy  WFL for tasks assessed/performed       Past Medical History:  Diagnosis Date  . Arthritis   . Hypertension   . Phlebitis    hx of in right leg - 2016     Past Surgical History:  Procedure Laterality Date  . JOINT REPLACEMENT  2003   right hip   . stents placed in right leg      AT [facility]- 2016   . TOTAL HIP ARTHROPLASTY Left 10/08/2016   Procedure: LEFT TOTAL HIP ARTHROPLASTY ANTERIOR APPROACH;  Surgeon: Kathryne Hitch, MD;  Location: [facility];  Service: Orthopedics;  Laterality: Left;  . TOTAL KNEE ARTHROPLASTY Right 10/28/2017   Procedure: RIGHT TOTAL KNEE ARTHROPLASTY;  Surgeon: Kathryne Hitch, MD;  Location: [facility];  Service: Orthopedics;  Laterality: Right;    There were no vitals filed for this visit.  Subjective Assessment - 01/05/18 1106    Subjective  [place] . 2 miles.  pain 5-6/10     Currently in Pain?  Yes    Pain Score  5     Pain Location  Knee    Pain Orientation  Right    Pain Descriptors / Indicators  Burning;Sharp    Pain Type  Chronic pain    Pain Onset  More than a month ago    Pain Frequency  Constant    Aggravating Factors    stretch , weight bearing         [facility] PT Assessment - 01/05/18 0001      AROM   Right Knee Flexion  100 before stretching      Strength   Overall Strength Comments  hip abduction and extension 4/5                  [facility] Adult PT Treatment/Exercise - 01/05/18 0001      Knee/Hip Exercises: Aerobic   Nustep  L5 x 5 minutes No UE support      Knee/Hip Exercises: Standing   Lateral Step Up  Right;10 reps;Limitations;Hand Hold: 1    Forward Step Up  20 reps;Hand Hold: 1;1 set;Right      Knee/Hip Exercises: Seated   Long Arc Quad Weight  10 lbs.    Long [employer] Limitations  30 reps 5 sec at a time      Manual Therapy   Passive ROM  flexion and extension stretching with joint mobs GR 4 AP and PA  along with patella mobs superior and inferior  glides  PT Short Term Goals - 12/29/17 1728      PT SHORT TERM GOAL #1   Title  Pt will initiate HEP in order to indicate improved functional mobility and reduced pain.      Status  Achieved      PT SHORT TERM GOAL #2   Title  He will improve RT knee flexion to 90 degrees active to improve mobility out of chair    Status  Achieved      PT SHORT TERM GOAL #3   Title  He will improve RT knee extension  to -20 degree s active extension  to inmprove gait pattern and increase walking in community    Baseline  -22 today and walking in community     Status  On-going        PT Long Term Goals - 01/05/18 1110      PT LONG TERM GOAL #1   Title  Pt will be independent with final HEP in order to indicate improved functional mobility and decreased pain.     Status  On-going      PT LONG TERM GOAL #2   Title  Pt will report no more than 5/10 pain with functional mobility in order to indicate pain is reduced limiting factor.      Status  On-going      PT LONG TERM GOAL #3   Title  Pt will improve knee extension to -15 deg in order to indicate improved ROM and functional in R knee.     Status  On-going       PT LONG TERM GOAL #4   Title  Pt will improve R knee flexion to 110 deg (AROM) with decreased reports of pain in order to indicate improved functional in R knee and allow walking full time no use of wheel chair.     Status  On-going      PT LONG TERM GOAL #5   Title  He will report walking full time with SPC or less for all normal community activity    Status  Achieved      PT LONG TERM GOAL #6   Title  He will improve gluteal strength to min 4/5 to improve tolerance on feet with activity    Baseline  4/5 today with verbal cues for effort     Status  Achieved            Plan - 01/05/18 1108    Clinical Impression Statement  Improved in walking tolerance as he was able to walk 2 miles on level and  gradual hills to PT today. soreness and fatigue from walk so eased with exercises and stretch but no incr pain at end of session.   COntinue stretnght and ROM    PT Treatment/Interventions  Cryotherapy;Stair training;Gait training;Functional mobility training;Therapeutic exercise;Patient/family education;Manual techniques;Taping;Vasopneumatic Device;Balance training    PT Next Visit Plan  Manual for ROM , expand HEP , vaso/cold if he wants post, gait and balance, strength    PT Home Exercise Plan  knee flexion and extension and hip RT extension stretching, side stepping with green band    Consulted and Agree with Plan of Care  Patient       Patient will benefit from skilled therapeutic intervention in order to improve the following deficits and impairments:  Pain, Difficulty walking, Decreased activity tolerance, Decreased range of motion, Decreased strength, Postural dysfunction, Increased edema  Visit Diagnosis: Stiffness of right knee, not elsewhere classified  Chronic  pain of right knee  Muscle weakness (generalized)  Difficulty in walking, not elsewhere classified  Other abnormalities of gait and mobility     Problem List Patient Active Problem List   Diagnosis Date  Noted  . Status post total right knee replacement 10/28/2017  . Dyslipidemia 10/12/2016  . Essential hypertension, benign 10/12/2016  . History of phlebitis 10/12/2016  . Unilateral primary osteoarthritis, left hip 10/08/2016  . Status post left hip replacement 10/08/2016    Caprice RedChasse, Zahirah Cheslock M  PT 01/05/2018, 11:53 AM  [facility] [address] [place], [place], [address] Phone: [phone]   Fax:  [phone]  Name: Delorise ShinerBroaderick D Prim MRN: [id number] Date of Birth: 05-23-1956

## 2018-01-10 ENCOUNTER — Ambulatory Visit: Payer: Medicaid Other

## 2018-01-10 DIAGNOSIS — R2689 Other abnormalities of gait and mobility: Secondary | ICD-10-CM

## 2018-01-10 DIAGNOSIS — G8929 Other chronic pain: Secondary | ICD-10-CM

## 2018-01-10 DIAGNOSIS — M25561 Pain in right knee: Secondary | ICD-10-CM

## 2018-01-10 DIAGNOSIS — R262 Difficulty in walking, not elsewhere classified: Secondary | ICD-10-CM

## 2018-01-10 DIAGNOSIS — M25661 Stiffness of right knee, not elsewhere classified: Secondary | ICD-10-CM | POA: Diagnosis not present

## 2018-01-10 DIAGNOSIS — M6281 Muscle weakness (generalized): Secondary | ICD-10-CM

## 2018-01-10 NOTE — Therapy (Signed)
[place], [place], [address] Phone: [phone]   Fax:  [phone]  Physical Therapy Treatment  Patient Details  Name: Kyle Fleming MRN: [id number] Date of Birth: 1956-10-18 Referring Provider: Jean Rosenthal, MD   Encounter Date: 01/10/2018  PT End of Session - 01/10/18 1115    Visit Number  8    Number of Visits  16    Date for PT Re-Evaluation  01/27/18    Authorization Type  Medicaid    Authorization Time Period   12/20/17 to 01/30/18    Authorization - Visit Number  6    Authorization - Number of Visits  12    PT Start Time  4496    PT Stop Time  1200    PT Time Calculation (min)  45 min    Activity Tolerance  Patient tolerated treatment well;No increased pain    Behavior During Therapy  WFL for tasks assessed/performed       Past Medical History:  Diagnosis Date  . Arthritis   . Hypertension   . Phlebitis    hx of in right leg - 2016     Past Surgical History:  Procedure Laterality Date  . JOINT REPLACEMENT  2003   right hip   . stents placed in right leg      AT [facility]- 2016   . TOTAL HIP ARTHROPLASTY Left 10/08/2016   Procedure: LEFT TOTAL HIP ARTHROPLASTY ANTERIOR APPROACH;  Surgeon: Mcarthur Rossetti, MD;  Location: [facility];  Service: Orthopedics;  Laterality: Left;  . TOTAL KNEE ARTHROPLASTY Right 10/28/2017   Procedure: RIGHT TOTAL KNEE ARTHROPLASTY;  Surgeon: Mcarthur Rossetti, MD;  Location: [facility];  Service: Orthopedics;  Laterality: Right;    There were no vitals filed for this visit.      [facility] PT Assessment - 01/10/18 0001      AROM   Right Knee Extension  -23    Right Knee Flexion  100      Strength   Overall Strength Comments  RThip abduction 4/5  and extension 4+/5 but tetsted on LT side as prone ROM limited  flexion 5-/5                  [facility] Adult PT Treatment/Exercise - 01/10/18 0001      Knee/Hip Exercises: Aerobic   Nustep  L5 x 5 minutes No UE support      Knee/Hip Exercises: Standing   Lateral Step Up  Right;Hand Hold: 1;20 reps    Lateral Step Up Limitations  11 inch step    Forward Step Up  20 reps;Hand Hold: 1;1 set;Right    Wall Squat  10 reps;10 seconds      Knee/Hip Exercises: Seated   Long Arc Quad  Right    Long Arc Quad Weight  12 lbs.    Long Arc Quad Limitations  x 30 eps 5 secd then 25 reps with 15 pounds      Knee/Hip Exercises: Sidelying   Hip ABduction  Right;15 reps;Limitations    Hip ABduction Limitations  then 10 reps with cues for active hip extension to align thigh with body more. Asked hime to try at home.       Knee/Hip Exercises: Prone   Hamstring Curl Limitations  30 reps 5 pounds      Manual Therapy   Passive ROM  flexion and extension stretching sustained 30-60 sec x 10-15 reps each with joint mobs GR 4  AP and PA  along with patella mobs superior and inferior  glides     Also hp extension stretching on RT.         PT Education - 01/10/18 1200    Education provided  Yes    Education Details  hip abduction in line with body    Person(s) Educated  Patient    Methods  Explanation;Demonstration;Tactile cues;Verbal cues    Comprehension  Returned demonstration;Verbalized understanding       PT Short Term Goals - 01/10/18 1124      PT SHORT TERM GOAL #1   Title  Pt will initiate HEP in order to indicate improved functional mobility and reduced pain.      Status  Achieved      PT SHORT TERM GOAL #2   Title  He will improve RT knee flexion to 90 degrees active to improve mobility out of chair    Status  Achieved      PT SHORT TERM GOAL #3   Title  He will improve RT knee extension  to -20 degree s active extension  to inmprove gait pattern and increase walking in community    Status  On-going        PT Long Term Goals - 01/10/18 1125      PT LONG TERM GOAL #1   Title  Pt will be independent with final HEP in order to indicate improved functional  mobility and decreased pain.     Baseline  adding to initial HEP    Status  On-going      PT LONG TERM GOAL #2   Title  Pt will report no more than 5/10 pain with functional mobility in order to indicate pain is reduced limiting factor.      Baseline  5/10 today    Status  Partially Met      PT LONG TERM GOAL #3   Title  Pt will improve knee extension to -15 deg in order to indicate improved ROM and functional in R knee.     Baseline  -22 today    Status  On-going      PT LONG TERM GOAL #4   Title  Pt will improve R knee flexion to 110 deg (AROM) with decreased reports of pain in order to indicate improved functional in R knee and allow walking full time no use of wheel chair.       PT LONG TERM GOAL #5   Title  He will report walking full time with SPC or less for all normal community activity    Status  Achieved      PT LONG TERM GOAL #6   Title  He will improve gluteal strength to min 4/5 to improve tolerance on feet with activity    Baseline  ext 4+/5 ab duction 4/5 hip ext limited son if slight hip flexion    Status  Achieved            Plan - 01/10/18 1116    Clinical Impression Statement  LT knee limites standing activity so this has to be single leg RT and will stop wall sits or any bilateral loaded exer.   Pain stable at this point but moderate though funciton significnaty improved     hip flexor tightness affects abduction  direction     PT Treatment/Interventions  Cryotherapy;Stair training;Gait training;Functional mobility training;Therapeutic exercise;Patient/family education;Manual techniques;Taping;Vasopneumatic Device;Balance training    PT Next Visit Plan  Manual for ROM ,  expand HEP , vaso/cold if he wants post, gait and balance, strength,  WORK more balance as able     PT Home Exercise Plan  knee flexion and extension and hip RT extension stretching, side stepping with green band    Consulted and Agree with Plan of Care  Patient       Patient will benefit  from skilled therapeutic intervention in order to improve the following deficits and impairments:  Pain, Difficulty walking, Decreased activity tolerance, Decreased range of motion, Decreased strength, Postural dysfunction, Increased edema  Visit Diagnosis: Stiffness of right knee, not elsewhere classified  Chronic pain of right knee  Muscle weakness (generalized)  Other abnormalities of gait and mobility  Difficulty in walking, not elsewhere classified     Problem List Patient Active Problem List   Diagnosis Date Noted  . Status post total right knee replacement 10/28/2017  . Dyslipidemia 10/12/2016  . Essential hypertension, benign 10/12/2016  . History of phlebitis 10/12/2016  . Unilateral primary osteoarthritis, left hip 10/08/2016  . Status post left hip replacement 10/08/2016    Darrel Hoover  PT 01/10/2018, 12:04 PM  [facility] [address] [place], [place], [address] Phone: [phone]   Fax:  [phone]  Name: Kyle Fleming MRN: [id number] Date of Birth: 1956/08/26

## 2018-01-12 ENCOUNTER — Ambulatory Visit: Payer: Medicaid Other

## 2018-01-12 DIAGNOSIS — M25661 Stiffness of right knee, not elsewhere classified: Secondary | ICD-10-CM

## 2018-01-12 DIAGNOSIS — R2689 Other abnormalities of gait and mobility: Secondary | ICD-10-CM

## 2018-01-12 DIAGNOSIS — M6281 Muscle weakness (generalized): Secondary | ICD-10-CM

## 2018-01-12 DIAGNOSIS — G8929 Other chronic pain: Secondary | ICD-10-CM

## 2018-01-12 DIAGNOSIS — R262 Difficulty in walking, not elsewhere classified: Secondary | ICD-10-CM

## 2018-01-12 DIAGNOSIS — M25561 Pain in right knee: Principal | ICD-10-CM

## 2018-01-12 NOTE — Therapy (Signed)
[place], [place], [address] Phone: [phone]   Fax:  [phone]  Physical Therapy Treatment  Patient Details  Name: Kyle Fleming MRN: [id number] Date of Birth: 1956-07-29 Referring Provider: Jean Rosenthal, MD   Encounter Date: 01/12/2018  PT End of Session - 01/12/18 1120    Visit Number  9    Number of Visits  16    Date for PT Re-Evaluation  01/30/18    Authorization Type  Medicaid    Authorization Time Period   12/20/17 to 01/30/18    Authorization - Visit Number  7    Authorization - Number of Visits  12    PT Start Time  0321    PT Stop Time  1125    PT Time Calculation (min)  55 min    Activity Tolerance  Patient tolerated treatment well;No increased pain    Behavior During Therapy  WFL for tasks assessed/performed       Past Medical History:  Diagnosis Date  . Arthritis   . Hypertension   . Phlebitis    hx of in right leg - 2016     Past Surgical History:  Procedure Laterality Date  . JOINT REPLACEMENT  2003   right hip   . stents placed in right leg      AT [facility]- 2016   . TOTAL HIP ARTHROPLASTY Left 10/08/2016   Procedure: LEFT TOTAL HIP ARTHROPLASTY ANTERIOR APPROACH;  Surgeon: Mcarthur Rossetti, MD;  Location: [facility];  Service: Orthopedics;  Laterality: Left;  . TOTAL KNEE ARTHROPLASTY Right 10/28/2017   Procedure: RIGHT TOTAL KNEE ARTHROPLASTY;  Surgeon: Mcarthur Rossetti, MD;  Location: [facility];  Service: Orthopedics;  Laterality: Right;    There were no vitals filed for this visit.      [facility] PT Assessment - 01/12/18 0001      AROM   Right Knee Extension  -23    Right Knee Flexion  100      Strength   Overall Strength Comments  RThip abduction 4/5  and extension 4+/5 but tetsted on LT side as prone ROM limited  flexion 5-/5                  [facility] Adult PT Treatment/Exercise - 01/12/18 0001      Ambulation/Gait   Assistive device   None    Ambulation Surface  Unlevel;Outdoor;Paved    Gait Comments  He was able to walk up and down  forward and back each way and sideway RT and LT up nad down and on grass mildly uneven terrain.  No LOB and no assist      Knee/Hip Exercises: Aerobic   Nustep  L5 x 5 minutes No UE support    Stepper  L6 8 min      Knee/Hip Exercises: Machines for Strengthening   Cybex Knee Extension  08-30-19 pounds x 12-15 rpes     Cybex Knee Flexion  15x20 reps      Knee/Hip Exercises: Standing   Forward Step Up  20 reps;Hand Hold: 1;1 set;Right    Step Down  Right;Step Height: 4";Hand Hold: 1;20 reps      Knee/Hip Exercises: Supine   Bridges  20 reps    Bridges Limitations  legs on ball then SLR  RT/LT x 20 off ball       Knee/Hip Exercises: Sidelying   Hip ABduction  Right;15 reps;Limitations    Hip ABduction Limitations  green badn , pelvic alignment better    Clams  green band x 15               PT Short Term Goals - 01/10/18 1124      PT SHORT TERM GOAL #1   Title  Pt will initiate HEP in order to indicate improved functional mobility and reduced pain.      Status  Achieved      PT SHORT TERM GOAL #2   Title  He will improve RT knee flexion to 90 degrees active to improve mobility out of chair    Status  Achieved      PT SHORT TERM GOAL #3   Title  He will improve RT knee extension  to -20 degree s active extension  to inmprove gait pattern and increase walking in community    Status  On-going        PT Long Term Goals - 01/10/18 1125      PT LONG TERM GOAL #1   Title  Pt will be independent with final HEP in order to indicate improved functional mobility and decreased pain.     Baseline  adding to initial HEP    Status  On-going      PT LONG TERM GOAL #2   Title  Pt will report no more than 5/10 pain with functional mobility in order to indicate pain is reduced limiting factor.      Baseline  5/10 today    Status  Partially Met      PT LONG TERM GOAL #3    Title  Pt will improve knee extension to -15 deg in order to indicate improved ROM and functional in R knee.     Baseline  -22 today    Status  On-going      PT LONG TERM GOAL #4   Title  Pt will improve R knee flexion to 110 deg (AROM) with decreased reports of pain in order to indicate improved functional in R knee and allow walking full time no use of wheel chair.       PT LONG TERM GOAL #5   Title  He will report walking full time with SPC or less for all normal community activity    Status  Achieved      PT LONG TERM GOAL #6   Title  He will improve gluteal strength to min 4/5 to improve tolerance on feet with activity    Baseline  ext 4+/5 ab duction 4/5 hip ext limited son if slight hip flexion    Status  Achieved            Plan - 01/12/18 1120    Clinical Impression Statement  Functional strength and mobility are excellant with RT knee pain real limiting factor.   He should be able to return to gym if he wants for walking and machines.        4 more viusits then discharge    PT Treatment/Interventions  Cryotherapy;Stair training;Gait training;Functional mobility training;Therapeutic exercise;Patient/family education;Manual techniques;Taping;Vasopneumatic Device;Balance training    PT Next Visit Plan  Manual for ROM , expand HEP , vaso/cold if he wants post, gait and balance, strength,  WORK more balance as able     PT Home Exercise Plan  knee flexion and extension and hip RT extension stretching, side stepping with green band, step ups     Consulted and Agree with Plan of Care  Patient  Patient will benefit from skilled therapeutic intervention in order to improve the following deficits and impairments:  Pain, Difficulty walking, Decreased activity tolerance, Decreased range of motion, Decreased strength, Postural dysfunction, Increased edema  Visit Diagnosis: Chronic pain of right knee  Stiffness of right knee, not elsewhere classified  Muscle weakness  (generalized)  Other abnormalities of gait and mobility  Difficulty in walking, not elsewhere classified     Problem List Patient Active Problem List   Diagnosis Date Noted  . Status post total right knee replacement 10/28/2017  . Dyslipidemia 10/12/2016  . Essential hypertension, benign 10/12/2016  . History of phlebitis 10/12/2016  . Unilateral primary osteoarthritis, left hip 10/08/2016  . Status post left hip replacement 10/08/2016    Kyle Fleming  PT 01/12/2018, 11:32 AM  [facility] [address] [place], [place], [address] Phone: [phone]   Fax:  [phone]  Name: Kyle Fleming MRN: [id number] Date of Birth: 1956-08-27

## 2018-01-16 ENCOUNTER — Telehealth (INDEPENDENT_AMBULATORY_CARE_PROVIDER_SITE_OTHER): Payer: Self-pay | Admitting: Orthopaedic Surgery

## 2018-01-16 ENCOUNTER — Ambulatory Visit (INDEPENDENT_AMBULATORY_CARE_PROVIDER_SITE_OTHER): Payer: Medicaid Other | Admitting: Physician Assistant

## 2018-01-16 ENCOUNTER — Other Ambulatory Visit (INDEPENDENT_AMBULATORY_CARE_PROVIDER_SITE_OTHER): Payer: Self-pay | Admitting: Orthopaedic Surgery

## 2018-01-16 MED ORDER — CYCLOBENZAPRINE HCL 10 MG PO TABS: 10.0000 mg | ORAL_TABLET | Freq: Two times a day (BID) | ORAL | 0 refills | Status: DC | PRN

## 2018-01-16 MED ORDER — OXYCODONE-ACETAMINOPHEN 5-325 MG PO TABS
1.0000 | ORAL_TABLET | Freq: Three times a day (TID) | ORAL | 0 refills | Status: AC | PRN
Start: 2018-01-16 — End: ?

## 2018-01-16 NOTE — Telephone Encounter (Signed)
Please advise He dropped that paper off that's beside your computer, he said you needed it?

## 2018-01-16 NOTE — Telephone Encounter (Signed)
He can come and pick up prescriptions for the oxycodone and Flexeril.  I see the therapy schedule that he has but regardless this is the last time I can provide oxycodone based on narcotics laws.

## 2018-01-16 NOTE — Telephone Encounter (Signed)
Patient aware these are ready for him at the front desk

## 2018-01-16 NOTE — Telephone Encounter (Signed)
Patient came  The office advised needing Rx refilled (Oxycodone and Flexeril) The number to contact patient is [phone]

## 2018-01-23 ENCOUNTER — Encounter (INDEPENDENT_AMBULATORY_CARE_PROVIDER_SITE_OTHER): Payer: Self-pay | Admitting: Physician Assistant

## 2018-01-23 ENCOUNTER — Ambulatory Visit (INDEPENDENT_AMBULATORY_CARE_PROVIDER_SITE_OTHER): Payer: Medicaid Other | Admitting: Physician Assistant

## 2018-01-23 DIAGNOSIS — Z96651 Presence of right artificial knee joint: Secondary | ICD-10-CM

## 2018-01-23 NOTE — Progress Notes (Signed)
HPI [name] returns today follow-up 87 days post right total knee arthroplasty.  He states he is overall doing well.  He has 3 more visits with therapy.  He is overall very happy with the results of the knee replacement.  Physical exam: Right knee 0-110 degrees flexion.  No instability valgus varus stressing.  Positive edema about the knee.  No erythema effusion or abnormal warmth.  Right calf supple nontender.  Surgical incisions healing well with no signs of infection.  Impression: Status post right total knee arthroplasty 10/28/2017  Plan: He will continue work on range of motion strengthening knee.  Follow-up with us in December at that time we will obtain AP and lateral views of the right knee.  He can always follow-up sooner if he has any questions or concerns.  He will finish therapy and transition to just a home exercise program.

## 2018-01-24 ENCOUNTER — Ambulatory Visit: Payer: Medicaid Other | Attending: Orthopaedic Surgery

## 2018-01-24 DIAGNOSIS — R262 Difficulty in walking, not elsewhere classified: Secondary | ICD-10-CM | POA: Diagnosis present

## 2018-01-24 DIAGNOSIS — M25661 Stiffness of right knee, not elsewhere classified: Secondary | ICD-10-CM | POA: Diagnosis present

## 2018-01-24 DIAGNOSIS — M6281 Muscle weakness (generalized): Secondary | ICD-10-CM | POA: Insufficient documentation

## 2018-01-24 DIAGNOSIS — G8929 Other chronic pain: Secondary | ICD-10-CM | POA: Diagnosis present

## 2018-01-24 DIAGNOSIS — R2689 Other abnormalities of gait and mobility: Secondary | ICD-10-CM | POA: Insufficient documentation

## 2018-01-24 DIAGNOSIS — M25561 Pain in right knee: Secondary | ICD-10-CM | POA: Diagnosis not present

## 2018-01-24 NOTE — Therapy (Signed)
[place], [place], [address] Phone: [phone]   Fax:  [phone]  Physical Therapy Treatment  Patient Details  Name: Kyle Fleming MRN: [id number] Date of Birth: 1956/03/05 Referring Provider: Jean Rosenthal, MD   Encounter Date: 01/24/2018  PT End of Session - 01/24/18 1138    Visit Number  10    Number of Visits  16    Date for PT Re-Evaluation  01/30/18    Authorization Type  Medicaid    Authorization Time Period   12/20/17 to 01/30/18    Authorization - Visit Number  8    Authorization - Number of Visits  12    PT Start Time  1050    PT Stop Time  2683    PT Time Calculation (min)  45 min    Activity Tolerance  Patient tolerated treatment well;No increased pain    Behavior During Therapy  WFL for tasks assessed/performed       Past Medical History:  Diagnosis Date  . Arthritis   . Hypertension   . Phlebitis    hx of in right leg - 2016     Past Surgical History:  Procedure Laterality Date  . JOINT REPLACEMENT  2003   right hip   . stents placed in right leg      AT [facility]- 2016   . TOTAL HIP ARTHROPLASTY Left 10/08/2016   Procedure: LEFT TOTAL HIP ARTHROPLASTY ANTERIOR APPROACH;  Surgeon: Mcarthur Rossetti, MD;  Location: [facility];  Service: Orthopedics;  Laterality: Left;  . TOTAL KNEE ARTHROPLASTY Right 10/28/2017   Procedure: RIGHT TOTAL KNEE ARTHROPLASTY;  Surgeon: Mcarthur Rossetti, MD;  Location: [facility];  Service: Orthopedics;  Laterality: Right;    There were no vitals filed for this visit.  Subjective Assessment - 01/24/18 1107    Subjective  No complaints. Except pain continues    Currently in Pain?  Yes    Pain Score  4     Pain Location  Knee    Pain Orientation  Right    Pain Descriptors / Indicators  Burning    Pain Type  Chronic pain    Pain Onset  More than a month ago    Pain Frequency  Constant         [facility] PT Assessment - 01/24/18 0001       AROM   Right Knee Extension  -20    Right Knee Flexion  108                  [facility] Adult PT Treatment/Exercise - 01/24/18 0001      Knee/Hip Exercises: Aerobic   Nustep  L7 x 10 minutes No UE       Knee/Hip Exercises: Seated   Long Arc Quad Weight  12 lbs.    Long [employer] Limitations  40 reps      Manual Therapy   Passive ROM  flexion and extension stretching sustained 30-60 sec x 10-15 reps each with joint mobs GR 4 AP and PA  along with patella mobs superior and inferior  glides               PT Short Term Goals - 01/10/18 1124      PT SHORT TERM GOAL #1   Title  Pt will initiate HEP in order to indicate improved functional mobility and reduced pain.      Status  Achieved  PT SHORT TERM GOAL #2   Title  He will improve RT knee flexion to 90 degrees active to improve mobility out of chair    Status  Achieved      PT SHORT TERM GOAL #3   Title  He will improve RT knee extension  to -20 degree s active extension  to inmprove gait pattern and increase walking in community    Status  On-going        PT Long Term Goals - 01/10/18 1125      PT LONG TERM GOAL #1   Title  Pt will be independent with final HEP in order to indicate improved functional mobility and decreased pain.     Baseline  adding to initial HEP    Status  On-going      PT LONG TERM GOAL #2   Title  Pt will report no more than 5/10 pain with functional mobility in order to indicate pain is reduced limiting factor.      Baseline  5/10 today    Status  Partially Met      PT LONG TERM GOAL #3   Title  Pt will improve knee extension to -15 deg in order to indicate improved ROM and functional in R knee.     Baseline  -22 today    Status  On-going      PT LONG TERM GOAL #4   Title  Pt will improve R knee flexion to 110 deg (AROM) with decreased reports of pain in order to indicate improved functional in R knee and allow walking full time no use of wheel chair.       PT LONG  TERM GOAL #5   Title  He will report walking full time with SPC or less for all normal community activity    Status  Achieved      PT LONG TERM GOAL #6   Title  He will improve gluteal strength to min 4/5 to improve tolerance on feet with activity    Baseline  ext 4+/5 ab duction 4/5 hip ext limited son if slight hip flexion    Status  Achieved            Plan - 01/24/18 1138    Clinical Impression Statement  Range still limited with extensinon and flexion though improved slight;y each direction a few degrees  He will be discharged at end of POC    PT Treatment/Interventions  Cryotherapy;Stair training;Gait training;Functional mobility training;Therapeutic exercise;Patient/family education;Manual techniques;Taping;Vasopneumatic Device;Balance training    PT Next Visit Plan  Manual for ROM , expand HEP , vaso/cold if he wants post, gait and balance, strength,  WORK more balance as able     PT Home Exercise Plan  knee flexion and extension and hip RT extension stretching, side stepping with green band, step ups     Consulted and Agree with Plan of Care  Patient       Patient will benefit from skilled therapeutic intervention in order to improve the following deficits and impairments:  Pain, Difficulty walking, Decreased activity tolerance, Decreased range of motion, Decreased strength, Postural dysfunction, Increased edema  Visit Diagnosis: Chronic pain of right knee  Muscle weakness (generalized)  Stiffness of right knee, not elsewhere classified     Problem List Patient Active Problem List   Diagnosis Date Noted  . Status post total right knee replacement 10/28/2017  . Dyslipidemia 10/12/2016  . Essential hypertension, benign 10/12/2016  . History of phlebitis 10/12/2016  .  Unilateral primary osteoarthritis, left hip 10/08/2016  . Status post left hip replacement 10/08/2016    Darrel Hoover PT  01/24/2018, 11:46 AM  [facility] [address] [place], [place], [address] Phone: [phone]   Fax:  [phone]  Name: ROMERO LETIZIA MRN: [id number] Date of Birth: 1955/12/13

## 2018-01-26 ENCOUNTER — Ambulatory Visit: Payer: Medicaid Other

## 2018-01-26 DIAGNOSIS — M25661 Stiffness of right knee, not elsewhere classified: Secondary | ICD-10-CM

## 2018-01-26 DIAGNOSIS — M6281 Muscle weakness (generalized): Secondary | ICD-10-CM

## 2018-01-26 DIAGNOSIS — R2689 Other abnormalities of gait and mobility: Secondary | ICD-10-CM

## 2018-01-26 DIAGNOSIS — M25561 Pain in right knee: Principal | ICD-10-CM

## 2018-01-26 DIAGNOSIS — R262 Difficulty in walking, not elsewhere classified: Secondary | ICD-10-CM

## 2018-01-26 DIAGNOSIS — G8929 Other chronic pain: Secondary | ICD-10-CM

## 2018-01-26 NOTE — Therapy (Signed)
[place], [place], [address] Phone: [phone]   Fax:  [phone]  Physical Therapy Treatment/Discharge  Patient Details  Name: Kyle Fleming MRN: [id number] Date of Birth: [birth year] Referring Provider: Jean Rosenthal, MD   Encounter Date: 01/26/2018  PT End of Session - 01/26/18 1129    Visit Number  11    Number of Visits  16    Date for PT Re-Evaluation  01/30/18    Authorization Type  Medicaid    Authorization Time Period   12/20/17 to 01/30/18    Authorization - Visit Number  9    Authorization - Number of Visits  12    PT Start Time  1055    PT Stop Time  1140    PT Time Calculation (min)  45 min    Activity Tolerance  Patient tolerated treatment well;No increased pain       Past Medical History:  Diagnosis Date  . Arthritis   . Hypertension   . Phlebitis    hx of in right leg - 2016     Past Surgical History:  Procedure Laterality Date  . JOINT REPLACEMENT  2003   right hip   . stents placed in right leg      AT [facility]- 2016   . TOTAL HIP ARTHROPLASTY Left 10/08/2016   Procedure: LEFT TOTAL HIP ARTHROPLASTY ANTERIOR APPROACH;  Surgeon: Mcarthur Rossetti, MD;  Location: [facility];  Service: Orthopedics;  Laterality: Left;  . TOTAL KNEE ARTHROPLASTY Right 10/28/2017   Procedure: RIGHT TOTAL KNEE ARTHROPLASTY;  Surgeon: Mcarthur Rossetti, MD;  Location: [facility];  Service: Orthopedics;  Laterality: Right;    There were no vitals filed for this visit.  Subjective Assessment - 01/26/18 1118    Subjective  Feels tight after walking today but less pain     Currently in Pain?  Yes    Pain Score  4     Pain Location  Knee    Pain Orientation  Right    Pain Descriptors / Indicators  Burning    Pain Type  Chronic pain    Pain Onset  More than a month ago    Pain Frequency  Constant         [facility] PT Assessment - 01/26/18 0001      AROM   Right Knee Extension  -20    Right Knee Flexion  108      Strength   Right Knee Flexion  5/5    Right Knee Extension  5/5    Left Knee Flexion  5/5    Left Knee Extension  5/5                            PT Short Term Goals - 01/10/18 1124      PT SHORT TERM GOAL #1   Title  Pt will initiate HEP in order to indicate improved functional mobility and reduced pain.      Status  Achieved      PT SHORT TERM GOAL #2   Title  He will improve RT knee flexion to 90 degrees active to improve mobility out of chair    Status  Achieved      PT SHORT TERM GOAL #3   Title  He will improve RT knee extension  to -20 degree s active extension  to inmprove gait pattern and increase walking in community  Status  On-going        PT Long Term Goals - 01/26/18 1150      PT LONG TERM GOAL #1   Title  Pt will be independent with final HEP in order to indicate improved functional mobility and decreased pain.     Status  Achieved      PT LONG TERM GOAL #2   Title  Pt will report no more than 5/10 pain with functional mobility in order to indicate pain is reduced limiting factor.      Baseline  4/10 today    Status  Partially Met      PT LONG TERM GOAL #3   Title  Pt will improve knee extension to -15 deg in order to indicate improved ROM and functional in R knee.     Baseline  -20 today    Status  Not Met      PT LONG TERM GOAL #4   Title  Pt will improve R knee flexion to 110 deg (AROM) with decreased reports of pain in order to indicate improved functional in R knee and allow walking full time no use of wheel chair.     Baseline  108 degrees today    Status  Partially Met      PT LONG TERM GOAL #5   Title  He will report walking full time with [facility] or less for all normal community activity    Baseline  Pt no longer using SPC, but still amb with antalgic gait though most of deviaiton appears due to lack of ROM    Status  Achieved      PT LONG TERM GOAL #6   Title  He will improve gluteal strength  to min 4/5 to improve tolerance on feet with activity    Baseline  ext 4+/5 ab duction 4/5 hip ext limited son if slight hip flexion    Status  Achieved            Plan - 01/26/18 1148    Clinical Impression Statement  He is independent with all community walking and though pain is moderate he is not limited with time on feet  for the activity he does now and his pain is generally tracking lower.   PAin and decr ROM affected goal attainment    PT Treatment/Interventions  Cryotherapy;Stair training;Gait training;Functional mobility training;Therapeutic exercise;Patient/family education;Manual techniques;Taping;Vasopneumatic Device;Balance training    PT Next Visit Plan  Discharge with HEP today    PT Home Exercise Plan  knee flexion and extension and hip RT extension stretching, side stepping with green band, step ups     Consulted and Agree with Plan of Care  Patient       Patient will benefit from skilled therapeutic intervention in order to improve the following deficits and impairments:  Pain, Difficulty walking, Decreased activity tolerance, Decreased range of motion, Decreased strength, Postural dysfunction, Increased edema  Visit Diagnosis: Chronic pain of right knee  Muscle weakness (generalized)  Stiffness of right knee, not elsewhere classified  Other abnormalities of gait and mobility  Difficulty in walking, not elsewhere classified     Problem List Patient Active Problem List   Diagnosis Date Noted  . Status post total right knee replacement 10/28/2017  . Dyslipidemia 10/12/2016  . Essential hypertension, benign 10/12/2016  . History of phlebitis 10/12/2016  . Unilateral primary osteoarthritis, left hip 10/08/2016  . Status post left hip replacement 10/08/2016    Darrel Hoover 01/26/2018, 11:57 AM  [place], [place], [address] Phone: [phone]   Fax:  [phone]  Name:  Kyle Fleming MRN: [id number] Date of Birth: 12-12-1955  PHYSICAL THERAPY DISCHARGE SUMMARY  Visits from Start of Care: 11  Current functional level related to goals / functional outcomes: See above   Remaining deficits: See above   Education / Equipment: HEP Plan: Patient agrees to discharge.  Patient goals were partially met. Patient is being discharged due to                                                     ?????    Max benefit from PT at this time

## 2018-01-30 ENCOUNTER — Telehealth (INDEPENDENT_AMBULATORY_CARE_PROVIDER_SITE_OTHER): Payer: Self-pay | Admitting: Orthopaedic Surgery

## 2018-01-30 ENCOUNTER — Ambulatory Visit: Payer: Medicaid Other

## 2018-01-30 ENCOUNTER — Other Ambulatory Visit (INDEPENDENT_AMBULATORY_CARE_PROVIDER_SITE_OTHER): Payer: Self-pay | Admitting: Radiology

## 2018-01-30 DIAGNOSIS — M25561 Pain in right knee: Principal | ICD-10-CM

## 2018-01-30 DIAGNOSIS — G8929 Other chronic pain: Secondary | ICD-10-CM

## 2018-01-30 MED ORDER — TRAMADOL HCL 50 MG PO TABS: 50.0000 mg | ORAL_TABLET | Freq: Four times a day (QID) | ORAL | 0 refills | Status: AC | PRN

## 2018-01-30 MED ORDER — CYCLOBENZAPRINE HCL 10 MG PO TABS: 10.0000 mg | ORAL_TABLET | Freq: Two times a day (BID) | ORAL | 0 refills | Status: AC | PRN

## 2018-01-30 NOTE — Therapy (Signed)
[facility] [address] [place], [place], [address] Phone: [phone]   Fax:  [phone]  Patient Details  Name: Kyle Fleming MRN: [id number] Date of Birth: December 09, 1955 Referring Provider:  Kathryne HitchBlackman, Christopher Y*  Encounter Date: 01/30/2018          This visit was supposed to be canceled . Kyle Fleming was called by our system and thought he was to be here today. We had discussed that last session was last visit.  He was OK not being seen.   Caprice RedChasse, [name] M  PT 01/30/2018, 11:07 AM  [facility] [address] [place], [place], [address] Phone: [phone]   Fax:  [phone]

## 2018-01-30 NOTE — Telephone Encounter (Signed)
Tried calling patient to verify pharmacy and to let him know that we can do ultram but no roxicet. No answer. No VM to LM

## 2018-01-30 NOTE — Telephone Encounter (Signed)
Patient came in requesting rx refill on roxicet and flexeril.

## 2018-01-30 NOTE — Telephone Encounter (Signed)
Done. Patient aware flexeril has been sent to pharmacy and tramadol is ready for pick up at our front desk.

## 2018-01-30 NOTE — Telephone Encounter (Signed)
Flexeril #40 zero , no to roxicet can do tramadol #30 zero one q 6 hours

## 2018-01-30 NOTE — Telephone Encounter (Signed)
Please advise. Ok to rf? 

## 2018-01-30 NOTE — Telephone Encounter (Signed)
Patient uses [facility]. Okay with tramadol and flexeril.

## 2018-08-16 IMAGING — DX DG HIP (WITH OR WITHOUT PELVIS) 2-3V*L*
3 series · 3 of 3 positions shown · non-contrast
Comparison: None.

CLINICAL DATA: Fall from wheelchair with left hip pain, initial
encounter

EXAM:
DG HIP (WITH OR WITHOUT PELVIS) 2-3V LEFT

[pelvis ap]
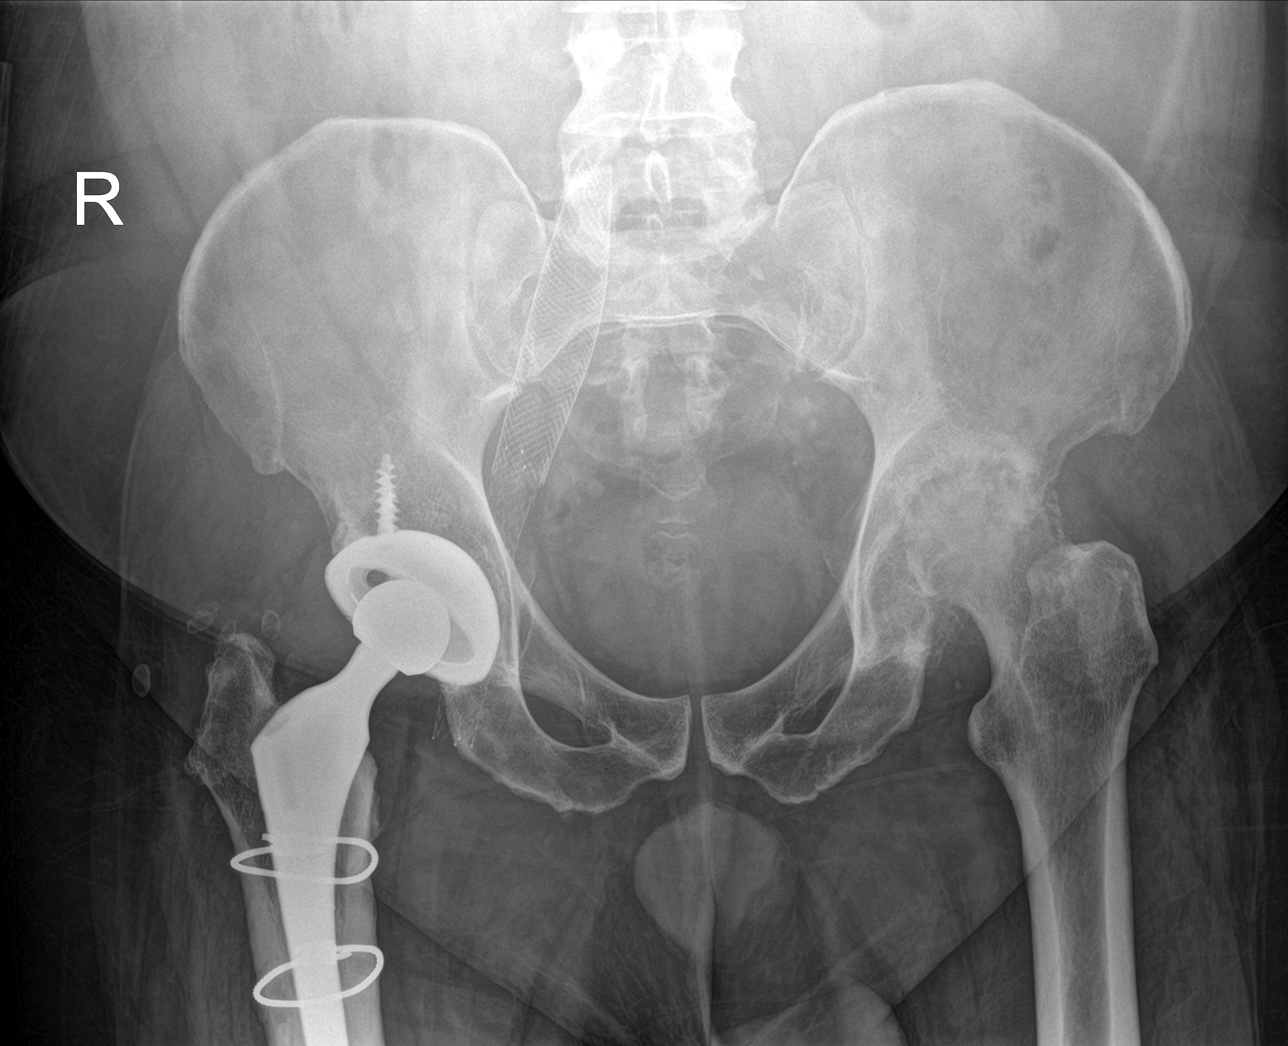

[hip ap]
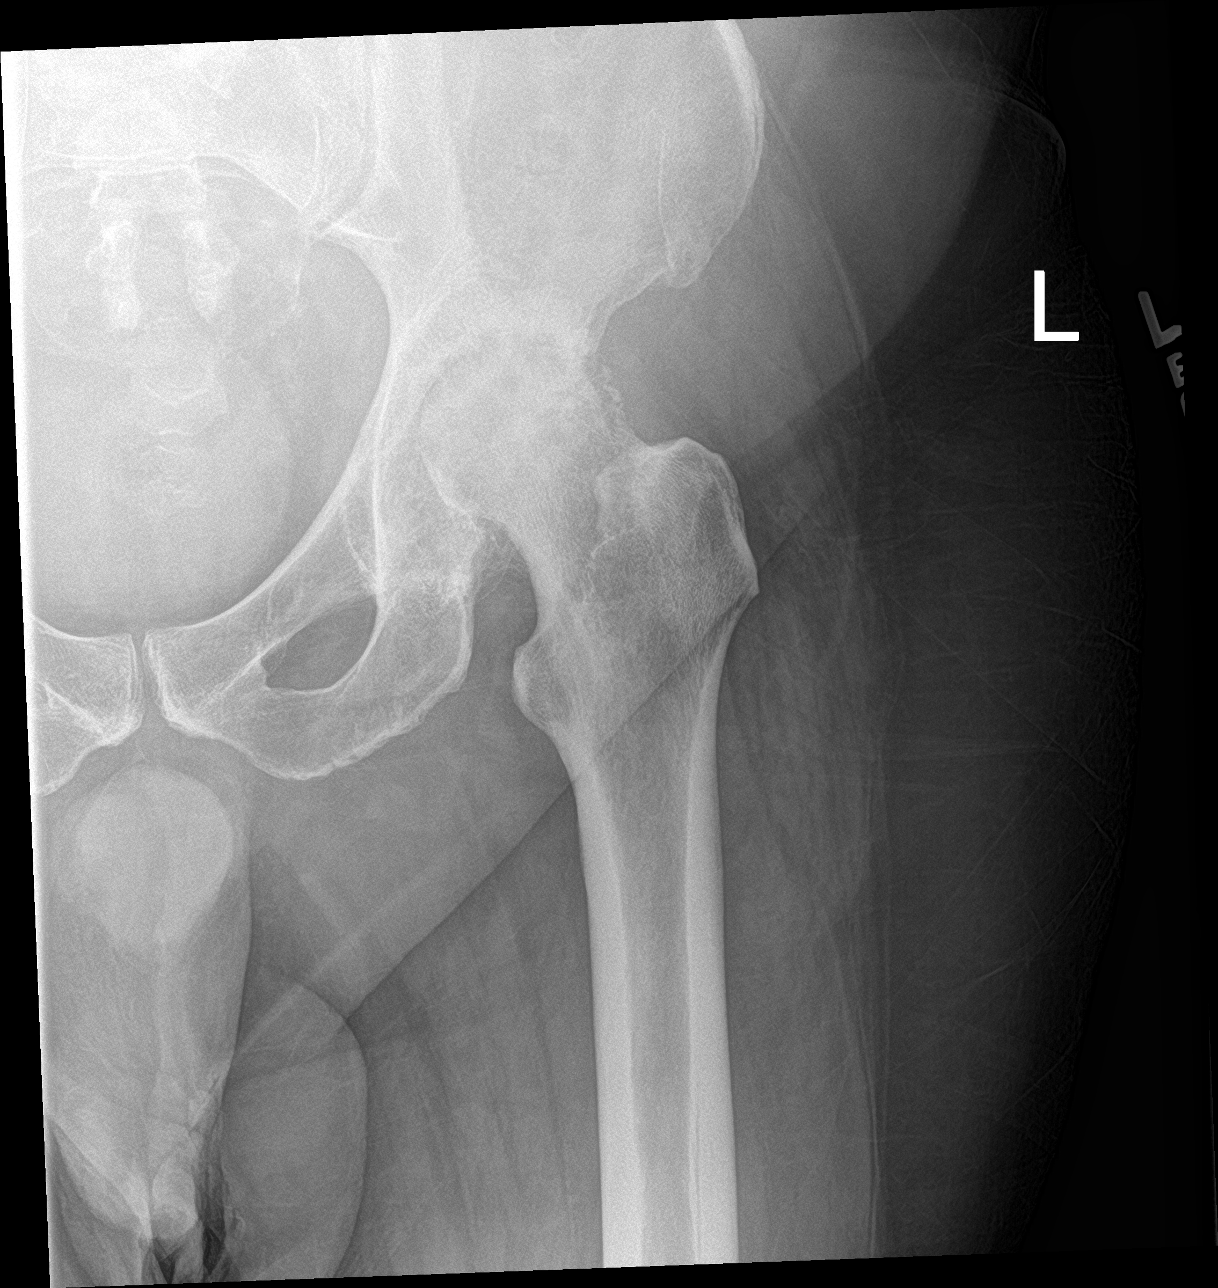

[hip lat]
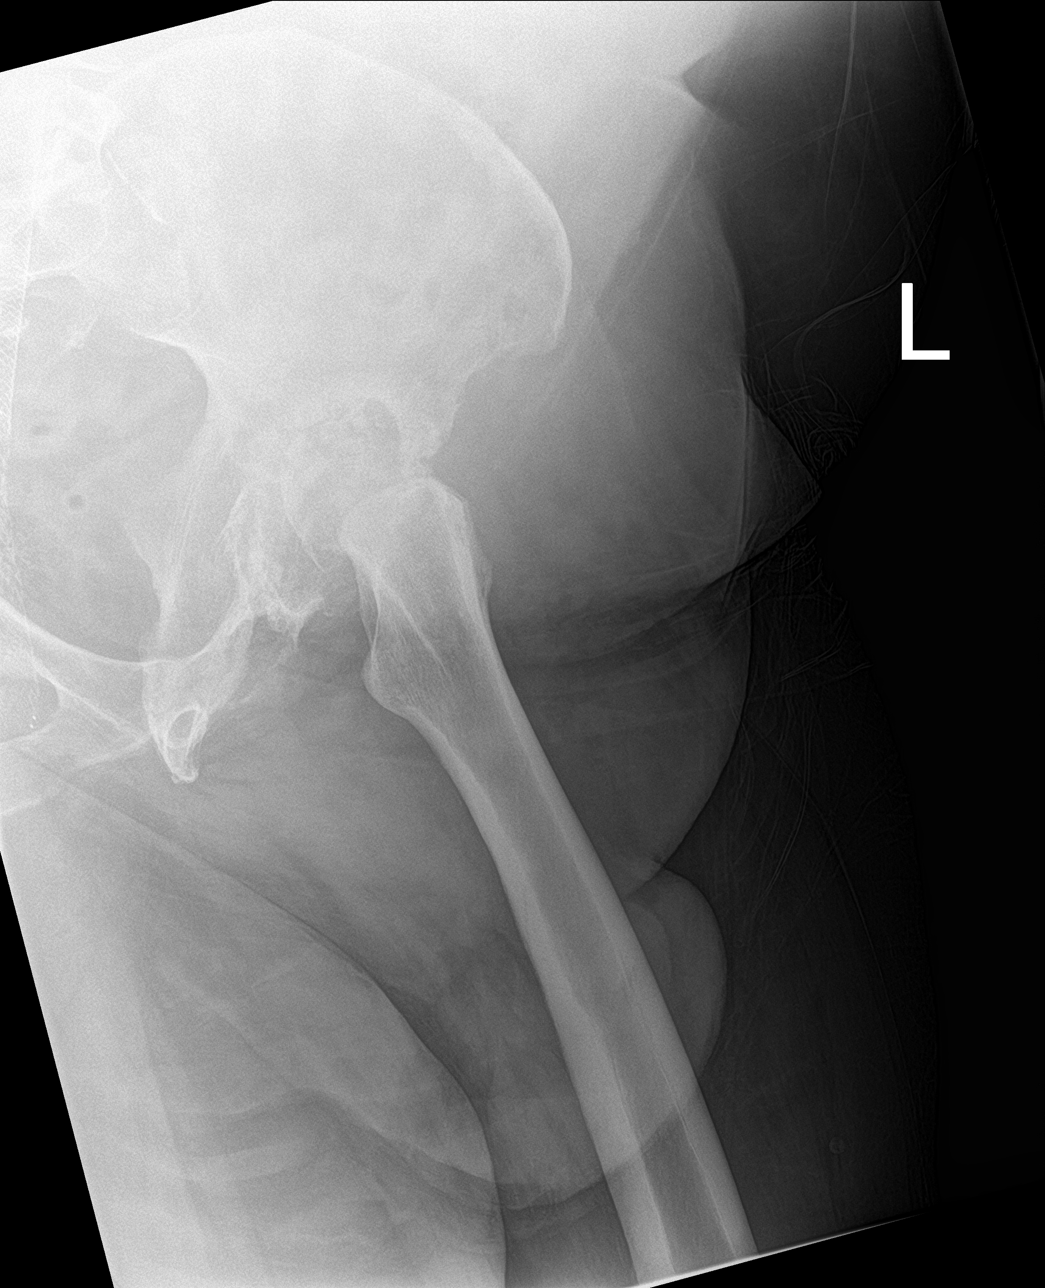

[3 of 3 positions shown; findings below may reference images not displayed]

FINDINGS: Right hip prosthesis is noted in satisfactory position. Severe
degenerative changes of the left hip joint are noted. Some
remodeling of the superior aspect of the acetabulum is seen. No
acute fracture or dislocation is noted. Vascular stenting is noted
on the right.
IMPRESSION: Severe degenerative change of the left hip joint as described. No
acute abnormality noted.

## 2018-08-16 IMAGING — DX DG SHOULDER 2+V*R*
3 series · 3 of 3 positions shown · non-contrast
Comparison: None.

CLINICAL DATA: Right shoulder pain.  Status post fall.

EXAM:
RIGHT SHOULDER - 2+ VIEW

[shoulder grashey]
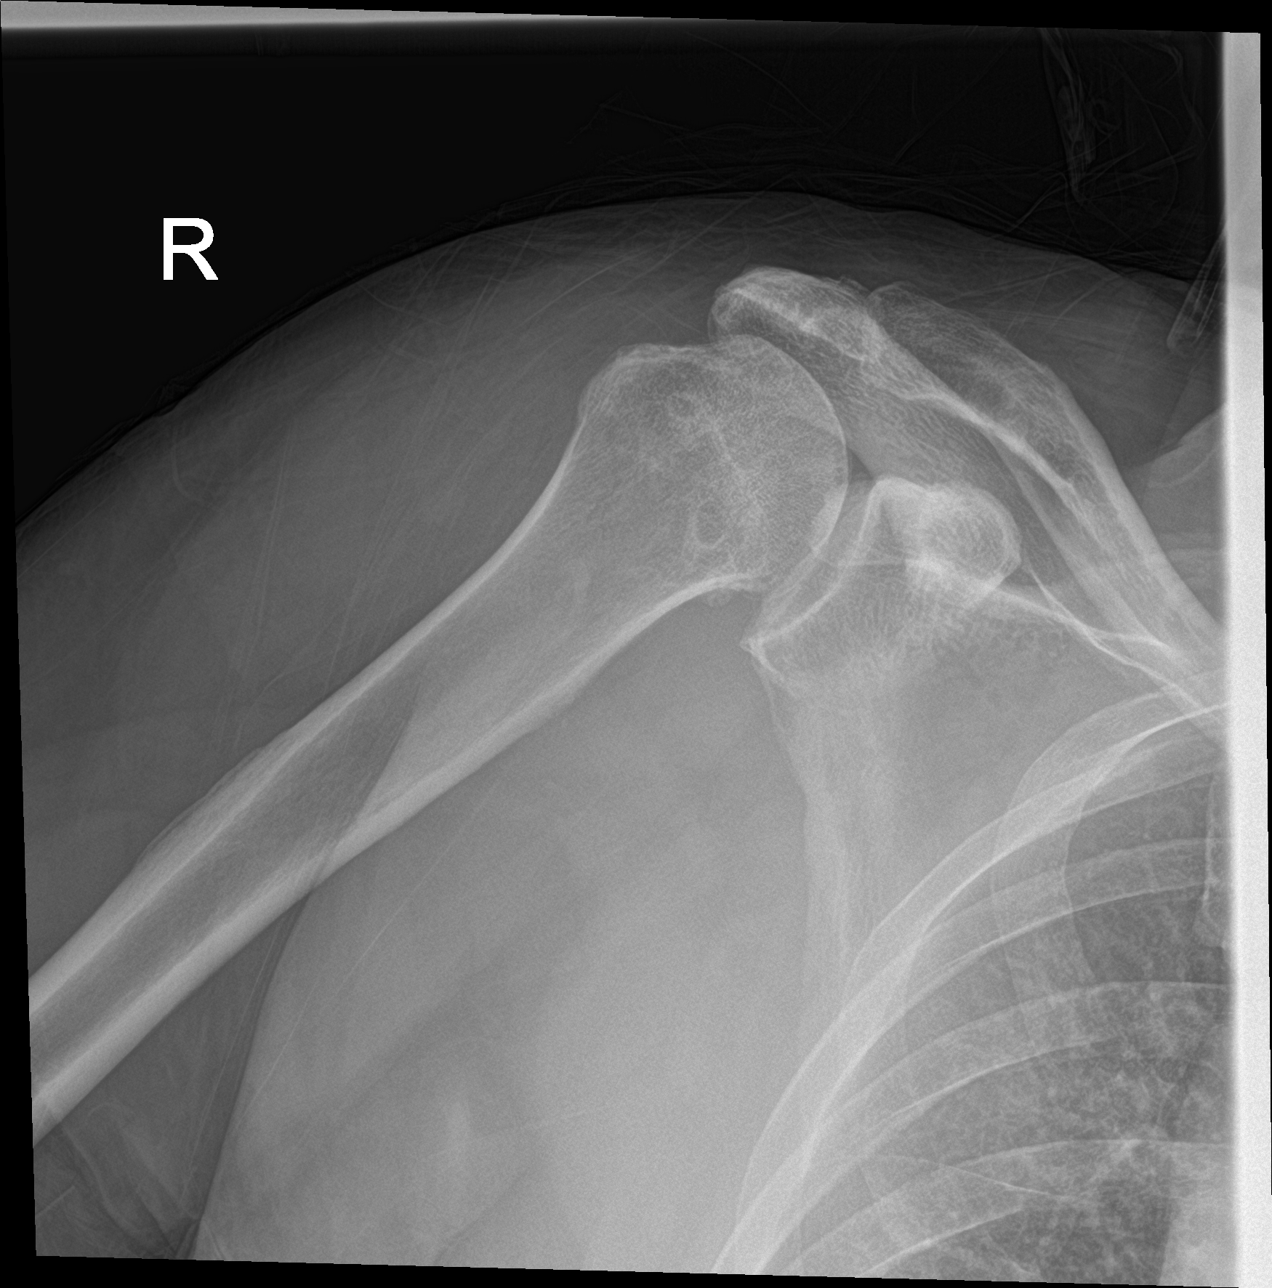

[shoulder axillary]
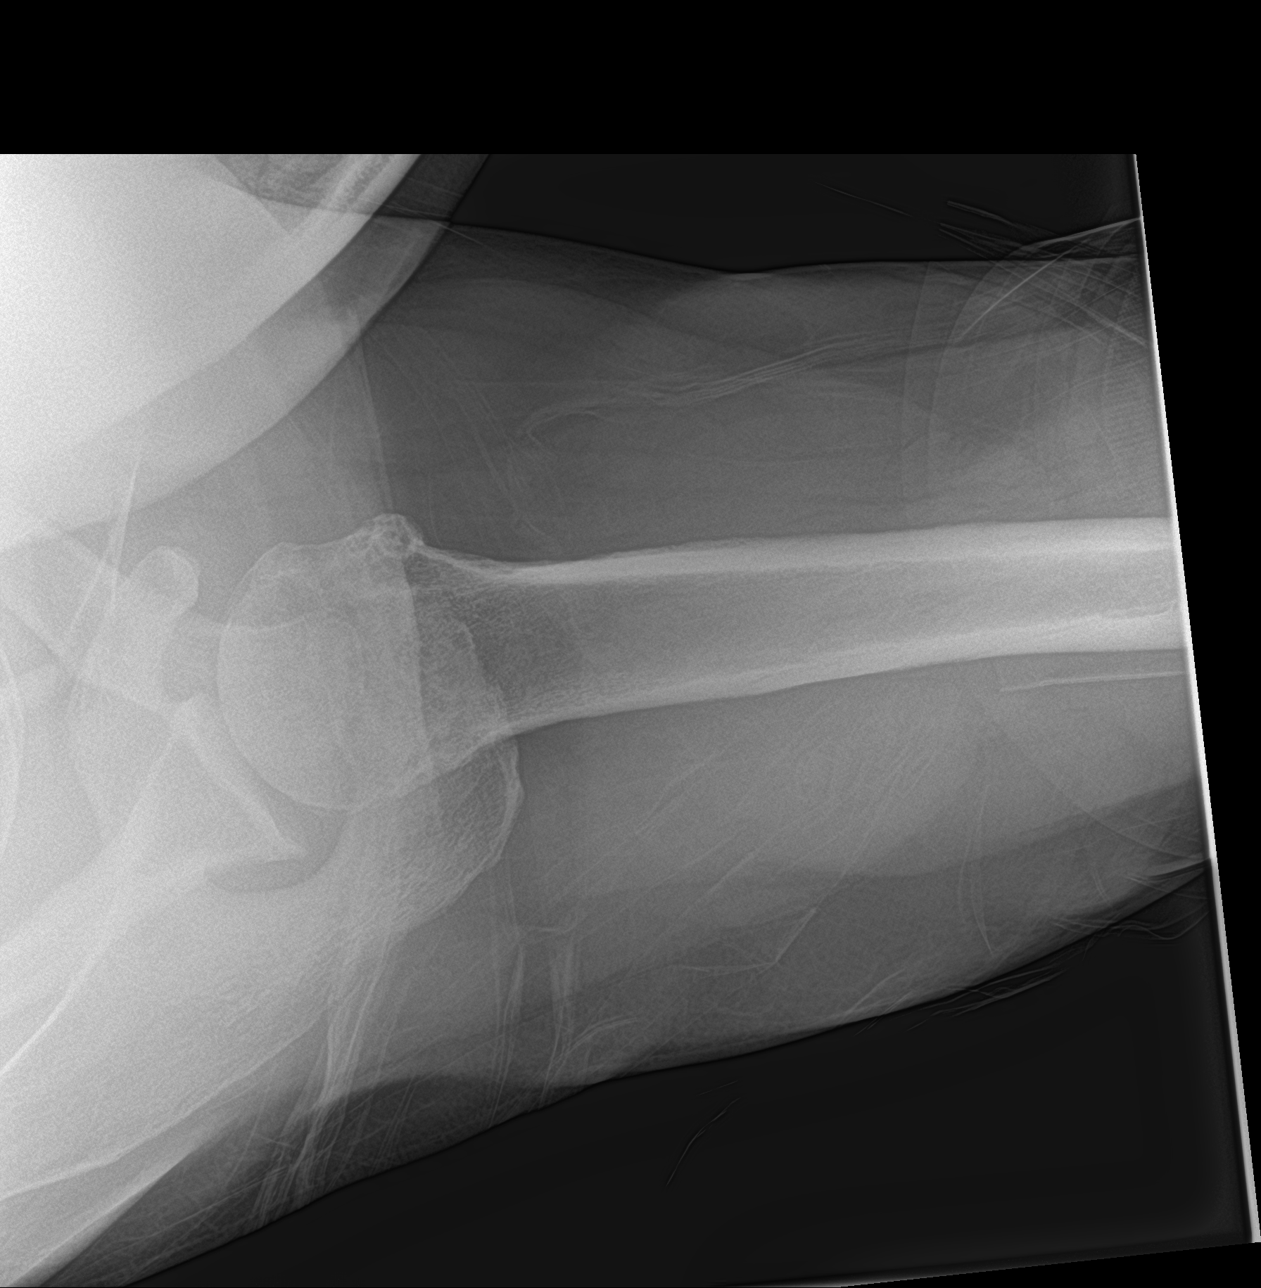

[shoulder y view]
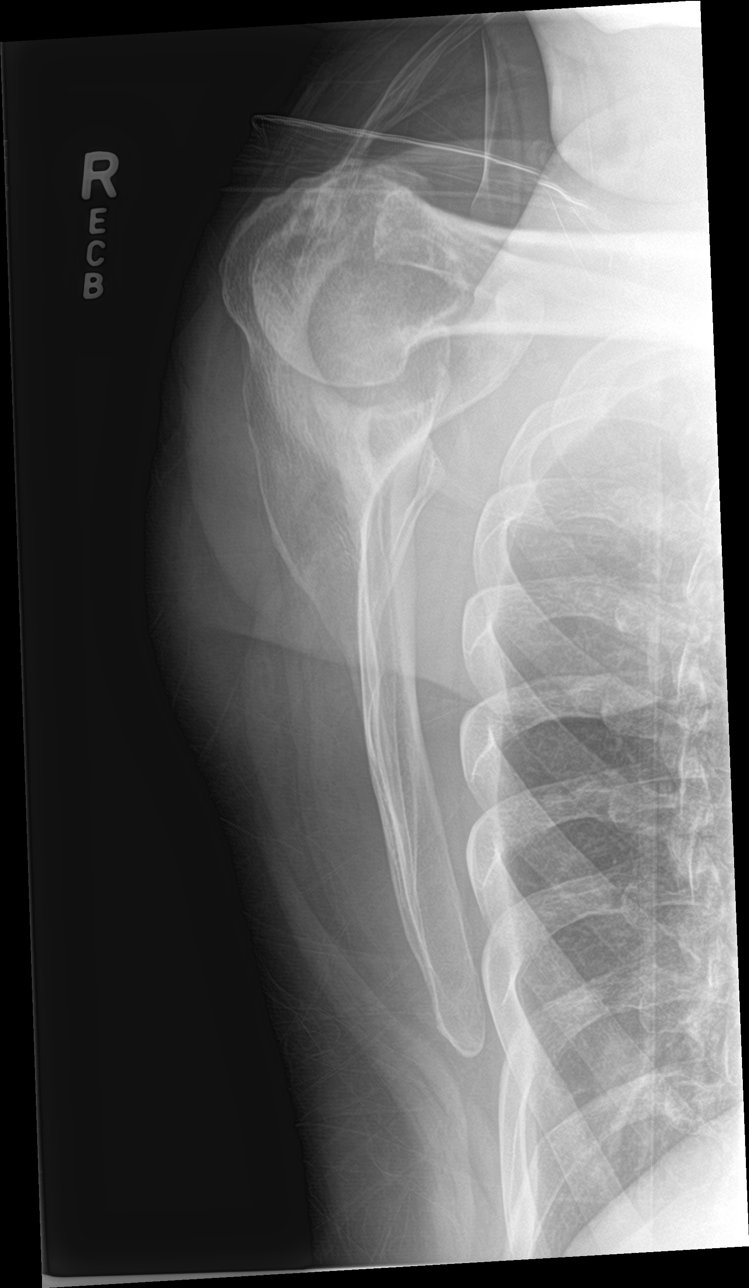

[3 of 3 positions shown; findings below may reference images not displayed]

FINDINGS: There is no fracture or dislocation. There are mild degenerative
changes of the acromioclavicular joint.
IMPRESSION: No acute osseous injury of the right shoulder.

## 2018-10-25 ENCOUNTER — Ambulatory Visit (INDEPENDENT_AMBULATORY_CARE_PROVIDER_SITE_OTHER): Payer: Medicaid Other | Admitting: Orthopaedic Surgery

## 2019-03-16 DEATH — deceased
